# Patient Record
Sex: Male | Born: 1960 | Race: White | Hispanic: No | Marital: Married | Smoking: Never smoker
Health system: Southern US, Community
[De-identification: ages and names within clinical notes are randomized; demographics above are authoritative.]

## PROBLEM LIST (undated history)

## (undated) DIAGNOSIS — Z8616 Personal history of COVID-19: Secondary | ICD-10-CM

## (undated) DIAGNOSIS — Z9889 Other specified postprocedural states: Secondary | ICD-10-CM

## (undated) DIAGNOSIS — N400 Enlarged prostate without lower urinary tract symptoms: Secondary | ICD-10-CM

## (undated) DIAGNOSIS — F40240 Claustrophobia: Secondary | ICD-10-CM

## (undated) DIAGNOSIS — M1990 Unspecified osteoarthritis, unspecified site: Secondary | ICD-10-CM

## (undated) DIAGNOSIS — E785 Hyperlipidemia, unspecified: Secondary | ICD-10-CM

## (undated) DIAGNOSIS — Z8489 Family history of other specified conditions: Secondary | ICD-10-CM

## (undated) DIAGNOSIS — N429 Disorder of prostate, unspecified: Secondary | ICD-10-CM

## (undated) DIAGNOSIS — R399 Unspecified symptoms and signs involving the genitourinary system: Secondary | ICD-10-CM

## (undated) HISTORY — PX: TOTAL ELBOW ARTHROPLASTY: SHX812

## (undated) HISTORY — PX: ELBOW ARTHROPLASTY: SHX928

## (undated) HISTORY — PX: ELBOW SURGERY: SHX618

---

## 1998-12-14 HISTORY — PX: NM RENAL LASIX (ARMC HX): HXRAD1213

## 2003-03-12 ENCOUNTER — Encounter: Payer: Self-pay | Admitting: Family Medicine

## 2003-03-12 ENCOUNTER — Ambulatory Visit (HOSPITAL_COMMUNITY): Admission: RE | Admit: 2003-03-12 | Discharge: 2003-03-12 | Payer: Self-pay | Admitting: Family Medicine

## 2004-03-31 ENCOUNTER — Ambulatory Visit (HOSPITAL_COMMUNITY): Admission: RE | Admit: 2004-03-31 | Discharge: 2004-03-31 | Payer: Self-pay | Admitting: Family Medicine

## 2004-04-24 ENCOUNTER — Encounter (HOSPITAL_COMMUNITY): Admission: RE | Admit: 2004-04-24 | Discharge: 2004-05-24 | Payer: Self-pay | Admitting: Neurosurgery

## 2006-02-01 ENCOUNTER — Ambulatory Visit: Payer: Self-pay | Admitting: Orthopedic Surgery

## 2006-02-03 ENCOUNTER — Encounter (HOSPITAL_COMMUNITY): Admission: RE | Admit: 2006-02-03 | Discharge: 2006-03-05 | Payer: Self-pay | Admitting: Orthopedic Surgery

## 2006-03-08 ENCOUNTER — Ambulatory Visit: Payer: Self-pay | Admitting: Orthopedic Surgery

## 2006-03-29 ENCOUNTER — Ambulatory Visit: Payer: Self-pay | Admitting: Orthopedic Surgery

## 2006-04-19 ENCOUNTER — Ambulatory Visit: Payer: Self-pay | Admitting: Orthopedic Surgery

## 2007-05-18 ENCOUNTER — Encounter: Admission: RE | Admit: 2007-05-18 | Discharge: 2007-05-18 | Payer: Self-pay | Admitting: Rheumatology

## 2007-12-01 ENCOUNTER — Encounter (INDEPENDENT_AMBULATORY_CARE_PROVIDER_SITE_OTHER): Payer: Self-pay | Admitting: Orthopedic Surgery

## 2007-12-01 ENCOUNTER — Inpatient Hospital Stay (HOSPITAL_COMMUNITY): Admission: RE | Admit: 2007-12-01 | Discharge: 2007-12-03 | Payer: Self-pay | Admitting: Orthopedic Surgery

## 2008-03-08 ENCOUNTER — Encounter (INDEPENDENT_AMBULATORY_CARE_PROVIDER_SITE_OTHER): Payer: Self-pay | Admitting: Orthopedic Surgery

## 2008-03-08 ENCOUNTER — Inpatient Hospital Stay (HOSPITAL_COMMUNITY): Admission: RE | Admit: 2008-03-08 | Discharge: 2008-03-10 | Payer: Self-pay | Admitting: Orthopedic Surgery

## 2010-10-14 ENCOUNTER — Encounter (INDEPENDENT_AMBULATORY_CARE_PROVIDER_SITE_OTHER): Payer: Self-pay | Admitting: *Deleted

## 2011-01-04 ENCOUNTER — Encounter: Payer: Self-pay | Admitting: Orthopedic Surgery

## 2011-01-13 NOTE — Letter (Signed)
Summary: PCP Colonoscopy Referral, Unable to Reach  [facility]  [address]   [place], [place] [address]   Phone: [phone]  Fax: [phone]     October 14, 2010  Tyler Baxter [address] [place], [place]  [address] [birth year]   Dear Tyler Baxter,   Your primary care physician has requested we schedule you for a screening colonoscopy exam.  However, our office has been unable to contact you by phone.  Please call our office at [phone] and ask for the nurse.   Thank you,    Rosine Beat  [facility] Ph: [phone]    Fax: [phone]

## 2011-04-28 NOTE — Op Note (Signed)
NAMEEDGEL, DEGNAN NO.:  [id number]   MEDICAL RECORD NO.:  [id number]          PATIENT TYPE:  INP   LOCATION:  2899                         FACILITY:  [facility]   PHYSICIAN:  Dionne Ano. Gramig III, M.D.DATE OF BIRTH:  1961-02-16   DATE OF PROCEDURE:  DATE OF DISCHARGE:                               OPERATIVE REPORT   PREOPERATIVE DIAGNOSIS:  Inflammatory arthropathy, right elbow, with end-  stage degenerative changes, extensive synovectomy, and failure of  conservative management.   POSTOPERATIVE DIAGNOSIS:  Inflammatory arthropathy, right elbow, with  end-stage degenerative changes, extensive synovectomy, and failure of  conservative management.   PROCEDURE:  1. Right total elbow arthroplasty with a Biomet Discovery elbow      implant.  2. Hardware removal, right elbow.  3. Ulnar nerve decompression and anterior transposition.  4. Evaluation under anesthesia.  5. Extensive synovectomy, extensive capsular release secondary to      chronic contracture, right elbow.   SURGEON:  Dionne Ano. [name], M.D.   ASSISTANT:  Karie Chimera, P.A.-C.   COMPLICATIONS:  None.   ANESTHESIA:  General with preoperative block.   ESTIMATED BLOOD LOSS:  Less than 150 mL.   DRAINS:  One.   TOURNIQUET TIME:  The patient had a tourniquet insufflated for 2 hours.  He then had a 15-minute deflation time followed by an additional 15  minutes inflation for final component cementing.   INDICATIONS FOR PROCEDURE:  This patient is a very pleasant male who  presents with the above-mentioned diagnosis.  He has failed conservative  management of his inflammatory arthropathy.  He is 50 years of age and  has exhausted all forms of care.  He has extensive abnormality about his  elbow with loss of motion.  Due to this, we are planning to proceed with  the above-mentioned operative intervention.  I have discussed with him  the risks and benefits of bleeding, infection, anesthesia,  damage to  normal structures, and failure of surgery to accomplish its intended  goals of relieving symptoms and restoring function.  With this in mind,  he desires to proceed.  All questions have been encouraged and answered  preoperatively.   OPERATION IN DETAIL:  The patient was seen by myself and anesthesia.  The arm was marked.  The permit was signed.  Extensive counseling  performed in the holding area.  Preoperative block was placed by the  anesthesia department.  Following this, he was taken to the procedural  suite.  He underwent a general anesthetic and was prepped and draped in  the usual sterile fashion with Betadine scrub and paint x10 minute  scrub.  He was placed in a modified lateral position.  All body parts  were well-padded.   The operation then commenced after sterile field was secured with  inflation of a sterile tourniquet to 250 mmHg.  A posterior incision was  made, and skin flaps were elevated.  Following this, I turned down the  skin flaps and performed identification of the ulnar nerve.  The arcade  of Struthers, medial intermuscular septum, cubital tunnel, two heads of  the FCU, and Osborne's ligament were released.  The patient tolerated  this well, and there were no complicating features.  Following this, the  patient then underwent mobilization of the nerve upon its epineurial  plexus of vessels.  Once this was done, the patient then had a vessel  loop placed around the nerve gently, and it was placed in a protected  position anteriorly where it was transposed at the conclusion of the  case.  The patient tolerated this well.  There were no complicating  features.  This was a distinct and separate portion of the operation I  should note.   Following this, I then incised the triceps and turned down the triceps.  This was taken directly off the bone.  A large amount of time was taken  for meticulous dissection about the periosteal tissue.  Following this,   the triceps was then very carefully mobilized.  I released the  collateral ligaments as necessary and then exposed the elbow.  Once this  was done, I then had to perform an extensive synovectomy of the joint.  The patient had a large amount of synovitis, chronic in nature, and a  complete synovectomy was performed both posteriorly and anteriorly.  Following this, I then performed a major aggressive capsular release  with combination Cobb elevator, scissor, and orthopedic instrument.  This was rather difficult, as the patient had very significantly stuck  down capsule.  Once this was accomplished, I then irrigated copiously  and made a saw cut off of the tip of the olecranon.   Following this, I then performed hardware removal and prior suture  removal from the lateral ulnar collateral ligament repair.  The suture  anchor was in the area of deep dissection, and it was removed in its  entirety, taking care not to destabilize the humerus.  Following deep  hardware removal, attention was then turned towards the total elbow  arthroplasty.   As mentioned, a saw cut was made off of the olecranon tip.  Once this  was done, the ulna was taken out of harm's way and preparation of the  humerus was accomplished.  The patient had a guide placed followed by a  drill bit followed by a five step reamer followed by oscillating saw  used about the central area and removal of the olecranon fossa remnants  was accomplished.  I then used the barrel reamer to shape the area.  Following this, broaching and canal preparation was accomplished  according to standard Discovery elbow implantation technique.  I then  made adjustments accordingly and sized the patient up to a #5 humerus  implant.  This sat nicely, and I made sure that this was contoured  perfectly.  I was very pleased with this and the fit.   Following this, a wet lap was placed over this area, and I then turned  attention towards the ulna.  This  was repaired with combination bur  followed by reaming of the canal followed by broaching and barrel reamer  preparation so that the implant would sit perfectly flush against the  ulna.  I was very pleased with the placement and following this placed  all trial implants in and placed the patient through a range of motion.  There was no impingement, locking, catching, and excellent look was  accomplished.  The size 4 implant was the perfect fit for the ulna.  Following this, I then irrigated copiously, and following irrigation, I  placed cement restricters according to  appropriate height in the ulna  and humerus.  I then mixed cement and placed the humerus component to my  satisfaction.  This was prepared with third generation cement technique.  This was allowed to harden.  At this juncture, 2 hours had been reached  and I deflated the tourniquet.  I allowed for recirculation time of 15  minutes or so and then reinflated the tourniquet and implanted the ulna  component under tourniquet control.  I was able to achieve an excellent  cement mantle and cemented both components separately, as I wanted to  have a most excellent cement mantle and excellent stability.  I allowed  the cement to cure with the elbow in extended position and realized full  motion with flexion extension after the cement cured and the area was  tested.  The patient tolerated this well, and there were no complicating  features.   Following this, I then performed repair of the triceps through bony  drill holes.  A Krakow stitch was placed in the triceps, and the  extending sutures were placed through drill holes made in the ulna.  Mellody Dance needles were advanced appropriately, and the sutures were then  tied down and then weaved through the triceps once again for a double  secure fit and repair of the triceps.  I was pleased with this.  Following this, the remainder of the triceps, which was taken down in  tongue fashion,  was repaired with buried FiberWire in a baseball stitch.   Following this, I then checked the nerve once again.  It was transposed  nicely in a subcutaneous region, sitting off the flexor pronator region,  and I tacked down the subcu.  The wound was then irrigated.  A drain was  placed.  Excellent pulses were noted, and hemostasis was adequate.  We  then closed the wound with 3-0 Vicryl followed by staple clips.  A large  bulky soft tissue dressing was placed, and the patient had an anterior  slab placed without difficulty.  Following this, we then performed  removal of the drapes, and the patient was then extubated and taken to  the recovery room.  He tolerated the procedure well, and there were no  complicating features.  All sponge, needle, and instrument counts were  reported as correct.  We will continue very close observation and look  forward to proceeding with his was postop care.  I have gone over all  these notes, etc.   I will plan for an aggressive rehab postoperatively to get him moving  swiftly and quickly.  It has been a pleasure to participate in his care,  and I look forward to participating in his postoperative recovery.           ______________________________  Dionne Ano. Everlene Other, M.D.     Nash Mantis  D:  12/01/2007  T:  12/02/2007  Job:  [id number]

## 2011-04-28 NOTE — Op Note (Signed)
Tyler Baxter, DIRK NO.:  [id number]   MEDICAL RECORD NO.:  [id number]          PATIENT TYPE:  INP   LOCATION:  5024                         FACILITY:  [facility]   PHYSICIAN:  Tyler Ano. 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   FINDINGS:  This patient has failed conservative management of his  inflammatory arthropathy and has end stage degenerative changes.  He  presents for reconstruction.  He has had a similar procedure performed  on the right side.   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   PROCEDURE:  1. Left total elbow arthroplasty with Biomet Discovery elbow implant.  2. Ulnar nerve decompression and anterior transposition.  3. Evaluation under anesthesia, left elbow.  4. Extensive synovectomy with capsular release secondary to chronic      contracture, left elbow.   SURGEON:  Tyler Baxter, M.D.   ASSISTANT:  Karie Chimera, P.A.-C.   COMPLICATIONS:  None.   ANESTHESIA:  Infraclavicular block with IV sedation.   TOURNIQUET TIME:  125 minutes followed by a 20 minute deflation time  followed by an additional insufflation of less than an hour.   SPECIMENS:  One.   ESTIMATED BLOOD LOSS:  Les than 100 mL.   INDICATIONS FOR PROCEDURE:  The patient is a very pleasant gentleman who  presents for the above mentioned diagnosis.  He has failed conservative  management of his inflammatory arthropathy.  He is 50 years of age.  He  has previously undergone a right total elbow arthroplasty with excellent  improvement.  Unfortunately, he is unable to brush his teeth, move the  elbow, or perform any meaningful activities and has horrific pain.  He  states that he understands risks and  benefits surgery and lifting  precautions of no lifting over 5 pounds indefinitely and desires to  proceed with the arthroplasty.  He understands the risks and benefits of  bleeding, infection, anesthesia, damage to normal structures, and  failure of the surgery to accomplish its intended goals of relieving  symptoms and restoring function. With this in mind, he desires to  proceed.  All questions have been encouraged and answered  preoperatively.  He very well understands the pre and postop regimen,  etc.   OPERATION IN DETAIL:  The patient was seen by myself and anesthesia,  taken to the operating suite, underwent the smooth induction of IV  sedation.  The permit was signed, time out was called, and the patient  had an infraclavicular block placed by the anesthesia department  preoperatively as noted.  This provided excellent anesthesia and he was  able to be laid in modified lateral position with bean bag support and  was sedated for the procedure.  He was not given a general anesthetic.  He underwent a thorough prep and drape with Betadine scrub and paint  followed by placement of Ioban and a  stockinette and sterile tourniquet.  The operation commenced with insufflation of the tourniquet.  250 mmHg  tourniquet insufflation was accomplished.  A posterior midline incision  was made.   Prior to this, the patient had evaluation under anesthesia revealing  significant loss of motion and chronic capsular as well as bony  contracture.   Once the patient had an incision made, skin flaps were elevated and,  following, the ulnar nerve was identified, dissected and underwent a  full decompression about the arcade of Struthers medial intermuscular  septum which was excised, the cubital tunnel, Osborne's ligament and two  heads of the FCU.  Following this, the patient then underwent placement  of a vessel loop around the nerve and careful transposition to the soft  tissue.  I then performed a  triceps turndown flap. The triceps was  elevated off of the ulna and later in the case was reattached, of  course.  This exposed the left elbow nicely from a posterior approach.  Following this, the patient underwent identification of the radial head.  The radial head was identified and underwent resection.  I resected the  radial head without difficulty and the patient tolerated this quite  well.  Following radial head resection, which was done without  difficulty, we then performed an exuberant synovectomy and capsular as  well as bony contracture release with a combination rongeur, blunt and  sharp dissection.  The patient had specimens sent and a complete  synovectomy was performed.  The patient tolerated this well.   Following this, I then performed placement of the humeral guide, a drill  hole was placed, fossa reamer followed by oscillating saw followed by  barrel reamer was then placed.  I then opened the canal, broached, and  performed IM rod preparation up to a size 4.  He accommodated a size 4  well and following this, with a combination of barrel reamer and other  orthopedic instruments, he was shaped to have a nice fit for a size 4  humeral component.   Following this, attention was turned towards the ulna. With a  combination bur, reamer, and broaching, we sized him up to a size 4  about the ulna.  I then placed trial components. All looked well and I  was pleased with this.  He had excellent range of motion and fit.  Once  this done, the tourniquet was deflated for greater than 20 minutes.  During this time, we performed placement of a Krakow stitch in the  triceps for later reattachment.  We also irrigated copiously and put  canal/cement restricters in the ulna and humerus to our satisfaction.  Once this was done, I then irrigated copiously and performed suction  followed by placement of cement in the humerus and placement of a size 4  component.  I allowed two separate  cement settings due to his young age  and my desire to have the best cement mantle possible.  I then performed  a similar cementing technique with third generation cement mixing and  preparation technique followed by placement of the ulnar component.  The  patient had the trial bushings placed and he was allowed to set up in a  nice position.  Once this was done, the final bushings for a Discovery  total elbow were placed.  He had excellent range of motion, no crepitus,  locking, popping, catching, and all looked quite well and I was pleased  with the findings.  The patient had a size  4 ulnar component and a size  4 humerus component and standard Discovery bushings as well as cement  restricters.   Following this, I then reattached the triceps through drill holes and an  imbricating FiberWire stitch.  Following this, the retinaculum/fascia  was closed without difficulty.  Once this was done, the ulnar nerve was  transposed about the flexor pronator region in an anterior state.  The  patient tolerated this well without difficulty.  Two drains were placed.  The wound was then closed with 3-0 Vicryl followed by staple clips in  the skin edge.  He tolerated this well.  He was placed in a splint at 30  degrees of extension and had excellent refill to the fingers, a strong  radial pulse, and soft compartments.  He was taken to the recovery room.  We will plan for ice, Ancef immediately and q.8h. and careful cautious  observation.  We will plan for x-rays to be taken in the recovery room  to evaluate the arthroplasty,  of course.  I have discussed with him the do's and don'ts, etc.  He  tolerated the procedure beautifully and there were no complicating  features.  We look forward to participating in his postop care.  He will  proceed according to standard postop algorithm.           ______________________________  Tyler Ano. Everlene Other, M.D.     Nash Mantis  D:  03/08/2008  T:  03/09/2008   Job:  [id number]

## 2011-09-07 LAB — CBC
HCT: 43.7
Hemoglobin: 15.2
MCHC: 34.8
RBC: 4.91

## 2011-09-07 LAB — BASIC METABOLIC PANEL
CO2: 28
Calcium: 9.6
Chloride: 103
GFR calc Af Amer: >60
Potassium: 4.4
Sodium: 137

## 2011-09-07 LAB — URINALYSIS, ROUTINE W REFLEX MICROSCOPIC
Glucose, UA: NEGATIVE
Hgb urine dipstick: NEGATIVE
Protein, ur: NEGATIVE

## 2011-09-07 LAB — HEPATIC FUNCTION PANEL
ALT: 34
Albumin: 4
Alkaline Phosphatase: 60
Total Protein: 6.7

## 2011-09-18 LAB — BASIC METABOLIC PANEL
BUN: 14
BUN: 16
CO2: 26
Chloride: 103
Chloride: 103
Creatinine, Ser: 0.72
Creatinine, Ser: 0.9
GFR calc non Af Amer: >60

## 2011-09-18 LAB — URINALYSIS, ROUTINE W REFLEX MICROSCOPIC
Ketones, ur: 15 — AB
Nitrite: NEGATIVE
Protein, ur: NEGATIVE

## 2011-09-18 LAB — CBC
MCHC: 34.3
MCV: 90.9
MCV: 90.9
Platelets: 262
Platelets: 308
RDW: 13.6
WBC: 8.8

## 2015-08-29 ENCOUNTER — Other Ambulatory Visit: Payer: Self-pay | Admitting: Orthopedic Surgery

## 2015-11-04 NOTE — Pre-Procedure Instructions (Signed)
    Tyler Baxter  11/04/2015   Your procedure is scheduled on Thursday, December 1.  [facility] at [facility]:30[facility]                 Your surgery is scheduled for 3:35 P.M.  Call this number if you have problems the morning of surgery:[phone]                For any other questions, please call [phone], Monday - Friday 8 AM - 4 PM.   Remember:  Do not eat food or drink liquids after midnight  Wednesday, November 30.  Take these medicines the morning of surgery with A SIP OF WATER: none    Do not wear jewelry, make-up or nail polish.   Do not wear lotions, powders, or perfumes.     Men may shave face and neck.   Do not bring valuables to the hospital.   [facility] any belongings or valuables.  Contacts, dentures or bridgework may not be worn into surgery.  Leave your suitcase in the car.  After surgery it may be brought to your room.  For patients admitted to the hospital, discharge time will be determined by your treatment team.  Patients discharged the day of surgery will not be allowed to drive home.   Name and phone number of your driver: -  Special instructions: -   Please read over the following fact sheets that you were given. Pain Booklet, Coughing and Deep Breathing and Surgical Site Infection Prevention

## 2015-11-05 ENCOUNTER — Encounter (HOSPITAL_COMMUNITY): Payer: Self-pay

## 2015-11-05 ENCOUNTER — Encounter (HOSPITAL_COMMUNITY)
Admission: RE | Admit: 2015-11-05 | Discharge: 2015-11-05 | Disposition: A | Discharge status: Home / Self Care | Payer: BLUE CROSS/BLUE SHIELD | Source: Ambulatory Visit | Attending: Orthopedic Surgery | Admitting: Orthopedic Surgery

## 2015-11-05 DIAGNOSIS — Z96621 Presence of right artificial elbow joint: Secondary | ICD-10-CM | POA: Insufficient documentation

## 2015-11-05 DIAGNOSIS — Y838 Other surgical procedures as the cause of abnormal reaction of the patient, or of later complication, without mention of misadventure at the time of the procedure: Secondary | ICD-10-CM | POA: Insufficient documentation

## 2015-11-05 DIAGNOSIS — T84038A Mechanical loosening of other internal prosthetic joint, initial encounter: Secondary | ICD-10-CM | POA: Insufficient documentation

## 2015-11-05 DIAGNOSIS — Z01812 Encounter for preprocedural laboratory examination: Secondary | ICD-10-CM | POA: Diagnosis present

## 2015-11-05 HISTORY — DX: Family history of other specified conditions: Z84.89

## 2015-11-05 HISTORY — DX: Unspecified osteoarthritis, unspecified site: M19.90

## 2015-11-05 HISTORY — DX: Other specified postprocedural states: Z98.890

## 2015-11-05 HISTORY — DX: Disorder of prostate, unspecified: N42.9

## 2015-11-05 LAB — CBC
HCT: 46.2 % (ref 39.0–52.0)
HEMOGLOBIN: 15.7 g/dL (ref 13.0–17.0)
MCH: 31.2 pg (ref 26.0–34.0)
MCHC: 34 g/dL (ref 30.0–36.0)
MCV: 91.7 fL (ref 78.0–100.0)
PLATELETS: 215 10*3/uL (ref 150–400)
RBC: 5.04 MIL/uL (ref 4.22–5.81)
RDW: 13.3 % (ref 11.5–15.5)
WBC: 4.5 10*3/uL (ref 4.0–10.5)

## 2015-11-05 NOTE — Pre-Procedure Instructions (Addendum)
Tyler HamGregory S Baxter  11/05/2015   Your procedure is scheduled on Thursday, December 1.  [facility] at 1:30[facility]                 .  Call this number if you have problems the morning of surgery:[phone]                For any other questions, please call [phone], Monday - Friday 8 AM - 4 PM.   Remember:  Do not eat food or drink liquids after midnight  Wednesday, November 30.  Take these medicines the morning of surgery with A SIP OF WATER: none   STOP all herbel meds, nsaids (aleve,naproxen,advil,ibuprofen) 5 days prior to surgery starting 11/09/15 including vitamins, aspirin   Do not wear jewelry, make-up or nail polish.   Do not wear lotions, powders, or perfumes.     Men may shave face and neck.   Do not bring valuables to the hospital.   [facility] is not responsible for any belongings or valuables.  Contacts, dentures or bridgework may not be worn into surgery.  Leave your suitcase in the car.  After surgery it may be brought to your room.  For patients admitted to the hospital, discharge time will be determined by your treatment team.  Patients discharged the day of surgery will not be allowed to drive home.   Name and phone number of your driver: -  Special instructions: - Special Instructions: [place] - Preparing for Surgery  Before surgery, you can play an important role.  Because skin is not sterile, your skin needs to be as free of germs as possible.  You can reduce the number of germs on you skin by washing with CHG (chlorahexidine gluconate) soap before surgery.  CHG is an antiseptic cleaner which kills germs and bonds with the skin to continue killing germs even after washing.  Please DO NOT use if you have an allergy to CHG or antibacterial soaps.  If your skin becomes reddened/irritated stop using the CHG and inform your nurse when you arrive at Short Stay.  Do not shave (including legs and underarms) for at least 48  hours prior to the first CHG shower.  You may shave your face.  Please follow these instructions carefully:   1.  Shower with CHG Soap the night before surgery and the morning of Surgery.  2.  If you choose to wash your hair, wash your hair first as usual with your normal shampoo.  3.  After you shampoo, rinse your hair and body thoroughly to remove the Shampoo.  4.  Use CHG as you would any other liquid soap.  You can apply chg directly  to the skin and wash gently with scrungie or a clean washcloth.  5.  Apply the CHG Soap to your body ONLY FROM THE NECK DOWN.  Do not use on open wounds or open sores.  Avoid contact with your eyes ears, mouth and genitals (private parts).  Wash genitals (private parts)       with your normal soap.  6.  Wash thoroughly, paying special attention to the area where your surgery will be performed.  7.  Thoroughly rinse your body with warm water from the neck down.  8.  DO NOT shower/wash with your normal soap after using and rinsing off the CHG Soap.  9.  Pat yourself dry with a clean towel.  10.  Wear clean pajamas.            11.  Place clean sheets on your bed the night of your first shower and do not sleep with pets.  Day of Surgery  Do not apply any lotions/deodorants the morning of surgery.  Please wear clean clothes to the hospital/surgery center.   Please read over the following fact sheets that you were given. Pain Booklet, Coughing and Deep Breathing and Surgical Site Infection Prevention

## 2015-11-05 NOTE — Progress Notes (Signed)
Jan goltare called office to fix permit reason:  S/p not on approved abberviations

## 2015-11-13 MED ORDER — CEFAZOLIN SODIUM-DEXTROSE 2-3 GM-% IV SOLR
2.0000 g | INTRAVENOUS | Status: AC
  Administered 2015-11-14 (×2): 2 g via INTRAVENOUS
  Filled 2015-11-13 (×2): qty 50

## 2015-11-13 MED ORDER — CHLORHEXIDINE GLUCONATE 4 % EX LIQD: 60.0000 mL | Freq: Once | CUTANEOUS | Status: DC

## 2015-11-13 MED ORDER — SODIUM CHLORIDE 0.45 % IV SOLN: INTRAVENOUS | Status: DC

## 2015-11-14 ENCOUNTER — Observation Stay (HOSPITAL_COMMUNITY)
Admission: RE | Admit: 2015-11-14 | Discharge: 2015-11-17 | Disposition: A | Discharge status: Home / Self Care | Payer: BLUE CROSS/BLUE SHIELD | Source: Ambulatory Visit | Attending: Orthopedic Surgery | Admitting: Orthopedic Surgery

## 2015-11-14 ENCOUNTER — Encounter (HOSPITAL_COMMUNITY)
Admission: RE | Disposition: A | Discharge status: Home / Self Care | Payer: Self-pay | Source: Ambulatory Visit | Attending: Orthopedic Surgery

## 2015-11-14 ENCOUNTER — Ambulatory Visit (HOSPITAL_COMMUNITY): Payer: BLUE CROSS/BLUE SHIELD

## 2015-11-14 ENCOUNTER — Ambulatory Visit (HOSPITAL_COMMUNITY): Payer: BLUE CROSS/BLUE SHIELD | Admitting: Anesthesiology

## 2015-11-14 DIAGNOSIS — T84038A Mechanical loosening of other internal prosthetic joint, initial encounter: Secondary | ICD-10-CM | POA: Diagnosis present

## 2015-11-14 DIAGNOSIS — Z96621 Presence of right artificial elbow joint: Secondary | ICD-10-CM | POA: Diagnosis not present

## 2015-11-14 DIAGNOSIS — Z96629 Presence of unspecified artificial elbow joint: Secondary | ICD-10-CM

## 2015-11-14 DIAGNOSIS — M1990 Unspecified osteoarthritis, unspecified site: Secondary | ICD-10-CM | POA: Diagnosis not present

## 2015-11-14 DIAGNOSIS — M25521 Pain in right elbow: Secondary | ICD-10-CM | POA: Insufficient documentation

## 2015-11-14 DIAGNOSIS — Y793 Surgical instruments, materials and orthopedic devices (including sutures) associated with adverse incidents: Secondary | ICD-10-CM | POA: Insufficient documentation

## 2015-11-14 DIAGNOSIS — Z419 Encounter for procedure for purposes other than remedying health state, unspecified: Secondary | ICD-10-CM

## 2015-11-14 HISTORY — PX: TOTAL ELBOW ARTHROPLASTY: SHX812

## 2015-11-14 SURGERY — ARTHROPLASTY, ELBOW, TOTAL
Anesthesia: Regional | Site: Elbow | Laterality: Right

## 2015-11-14 MED ORDER — LACTATED RINGERS IV SOLN
INTRAVENOUS | Status: DC
  Administered 2015-11-14 (×3): via INTRAVENOUS

## 2015-11-14 MED ORDER — SODIUM CHLORIDE 0.9 % IR SOLN
Status: DC | PRN
  Administered 2015-11-14: 3000 mL

## 2015-11-14 MED ORDER — SODIUM CHLORIDE 0.45 % IV SOLN
INTRAVENOUS | Status: DC
  Administered 2015-11-14: 23:00:00 via INTRAVENOUS

## 2015-11-14 MED ORDER — PROPOFOL 10 MG/ML IV BOLUS
INTRAVENOUS | Status: DC | PRN
  Administered 2015-11-14: 200 mg via INTRAVENOUS

## 2015-11-14 MED ORDER — FENTANYL CITRATE (PF) 100 MCG/2ML IJ SOLN
INTRAMUSCULAR | Status: DC | PRN
  Administered 2015-11-14 (×2): 50 ug via INTRAVENOUS

## 2015-11-14 MED ORDER — EPHEDRINE SULFATE 50 MG/ML IJ SOLN
INTRAMUSCULAR | Status: DC | PRN
  Administered 2015-11-14: 5 mg via INTRAVENOUS
  Administered 2015-11-14 (×2): 10 mg via INTRAVENOUS

## 2015-11-14 MED ORDER — ONDANSETRON HCL 4 MG/2ML IJ SOLN
4.0000 mg | Freq: Four times a day (QID) | INTRAMUSCULAR | Status: DC | PRN
  Administered 2015-11-14 – 2015-11-15 (×2): 4 mg via INTRAVENOUS
  Filled 2015-11-14 (×2): qty 2

## 2015-11-14 MED ORDER — BUPIVACAINE-EPINEPHRINE (PF) 0.5% -1:200000 IJ SOLN
INTRAMUSCULAR | Status: DC | PRN
  Administered 2015-11-14: 25 mL via PERINEURAL

## 2015-11-14 MED ORDER — FENTANYL CITRATE (PF) 100 MCG/2ML IJ SOLN
INTRAMUSCULAR | Status: AC
  Administered 2015-11-14: 50 ug
  Filled 2015-11-14: qty 2

## 2015-11-14 MED ORDER — CEFAZOLIN SODIUM 1-5 GM-% IV SOLN
1.0000 g | Freq: Three times a day (TID) | INTRAVENOUS | Status: DC
  Administered 2015-11-15 – 2015-11-17 (×7): 1 g via INTRAVENOUS
  Filled 2015-11-14 (×9): qty 50

## 2015-11-14 MED ORDER — LIDOCAINE HCL (CARDIAC) 20 MG/ML IV SOLN
INTRAVENOUS | Status: DC | PRN
  Administered 2015-11-14: 80 mg via INTRAVENOUS

## 2015-11-14 MED ORDER — ONDANSETRON HCL 4 MG/2ML IJ SOLN
INTRAMUSCULAR | Status: AC
  Filled 2015-11-14: qty 2

## 2015-11-14 MED ORDER — ALPRAZOLAM 0.5 MG PO TABS: 0.5000 mg | ORAL_TABLET | Freq: Four times a day (QID) | ORAL | Status: DC | PRN

## 2015-11-14 MED ORDER — CEFAZOLIN SODIUM-DEXTROSE 2-3 GM-% IV SOLR
INTRAVENOUS | Status: AC
  Filled 2015-11-14: qty 50

## 2015-11-14 MED ORDER — HYDROMORPHONE HCL 1 MG/ML IJ SOLN: 0.5000 mg | INTRAMUSCULAR | Status: DC | PRN

## 2015-11-14 MED ORDER — DEXAMETHASONE SODIUM PHOSPHATE 10 MG/ML IJ SOLN
INTRAMUSCULAR | Status: DC | PRN
  Administered 2015-11-14: 10 mg via INTRAVENOUS

## 2015-11-14 MED ORDER — GLYCOPYRROLATE 0.2 MG/ML IJ SOLN
INTRAMUSCULAR | Status: AC
  Filled 2015-11-14: qty 1

## 2015-11-14 MED ORDER — FENTANYL CITRATE (PF) 100 MCG/2ML IJ SOLN
INTRAMUSCULAR | Status: AC
  Administered 2015-11-14: 50 ug via INTRAVENOUS
  Filled 2015-11-14: qty 2

## 2015-11-14 MED ORDER — CEFAZOLIN SODIUM 1-5 GM-% IV SOLN
1.0000 g | INTRAVENOUS | Status: DC
  Filled 2015-11-14: qty 50

## 2015-11-14 MED ORDER — FENTANYL CITRATE (PF) 250 MCG/5ML IJ SOLN
INTRAMUSCULAR | Status: AC
  Filled 2015-11-14: qty 5

## 2015-11-14 MED ORDER — ONDANSETRON HCL 4 MG/2ML IJ SOLN
INTRAMUSCULAR | Status: DC | PRN
  Administered 2015-11-14: 4 mg via INTRAVENOUS

## 2015-11-14 MED ORDER — EPHEDRINE SULFATE 50 MG/ML IJ SOLN
INTRAMUSCULAR | Status: AC
  Filled 2015-11-14: qty 1

## 2015-11-14 MED ORDER — NEOSTIGMINE METHYLSULFATE 10 MG/10ML IV SOLN
INTRAVENOUS | Status: AC
  Filled 2015-11-14: qty 1

## 2015-11-14 MED ORDER — ONDANSETRON HCL 4 MG PO TABS: 4.0000 mg | ORAL_TABLET | Freq: Four times a day (QID) | ORAL | Status: DC | PRN

## 2015-11-14 MED ORDER — ARTIFICIAL TEARS OP OINT
TOPICAL_OINTMENT | OPHTHALMIC | Status: DC | PRN
Start: 2015-11-14 — End: 2015-11-14
  Administered 2015-11-14: 1 via OPHTHALMIC

## 2015-11-14 MED ORDER — DIPHENHYDRAMINE HCL 25 MG PO CAPS: 25.0000 mg | ORAL_CAPSULE | Freq: Four times a day (QID) | ORAL | Status: DC | PRN

## 2015-11-14 MED ORDER — MIDAZOLAM HCL 5 MG/5ML IJ SOLN
INTRAMUSCULAR | Status: DC | PRN
  Administered 2015-11-14: 2 mg via INTRAVENOUS

## 2015-11-14 MED ORDER — PROMETHAZINE HCL 25 MG/ML IJ SOLN: 6.2500 mg | INTRAMUSCULAR | Status: DC | PRN

## 2015-11-14 MED ORDER — MIDAZOLAM HCL 2 MG/2ML IJ SOLN
INTRAMUSCULAR | Status: AC
Start: 2015-11-14 — End: 2015-11-14
  Administered 2015-11-14: 2 mg
  Filled 2015-11-14: qty 2

## 2015-11-14 MED ORDER — MIDAZOLAM HCL 2 MG/2ML IJ SOLN
INTRAMUSCULAR | Status: AC
  Filled 2015-11-14: qty 2

## 2015-11-14 MED ORDER — PROPOFOL 10 MG/ML IV BOLUS
INTRAVENOUS | Status: AC
  Filled 2015-11-14: qty 20

## 2015-11-14 MED ORDER — TAMSULOSIN HCL 0.4 MG PO CAPS
0.4000 mg | ORAL_CAPSULE | Freq: Every evening | ORAL | Status: DC
  Administered 2015-11-14 – 2015-11-16 (×3): 0.4 mg via ORAL
  Filled 2015-11-14 (×3): qty 1

## 2015-11-14 MED ORDER — METHOCARBAMOL 1000 MG/10ML IJ SOLN
500.0000 mg | Freq: Four times a day (QID) | INTRAMUSCULAR | Status: DC | PRN
  Filled 2015-11-14: qty 5

## 2015-11-14 MED ORDER — 0.9 % SODIUM CHLORIDE (POUR BTL) OPTIME
TOPICAL | Status: DC | PRN
  Administered 2015-11-14 (×3): 1000 mL

## 2015-11-14 MED ORDER — PROMETHAZINE HCL 25 MG RE SUPP: 12.5000 mg | Freq: Four times a day (QID) | RECTAL | Status: DC | PRN

## 2015-11-14 MED ORDER — ROCURONIUM BROMIDE 100 MG/10ML IV SOLN
INTRAVENOUS | Status: DC | PRN
  Administered 2015-11-14: 30 mg via INTRAVENOUS

## 2015-11-14 MED ORDER — DOCUSATE SODIUM 100 MG PO CAPS
100.0000 mg | ORAL_CAPSULE | Freq: Two times a day (BID) | ORAL | Status: DC
  Administered 2015-11-14 – 2015-11-17 (×6): 100 mg via ORAL
  Filled 2015-11-14 (×7): qty 1

## 2015-11-14 MED ORDER — ROCURONIUM BROMIDE 50 MG/5ML IV SOLN
INTRAVENOUS | Status: AC
  Filled 2015-11-14: qty 1

## 2015-11-14 MED ORDER — PANTOPRAZOLE SODIUM 40 MG PO TBEC: 40.0000 mg | DELAYED_RELEASE_TABLET | Freq: Two times a day (BID) | ORAL | Status: DC | PRN

## 2015-11-14 MED ORDER — ADULT MULTIVITAMIN W/MINERALS CH
1.0000 | ORAL_TABLET | Freq: Every day | ORAL | Status: DC
  Administered 2015-11-15 – 2015-11-17 (×3): 1 via ORAL
  Filled 2015-11-14 (×3): qty 1

## 2015-11-14 MED ORDER — LIDOCAINE HCL (CARDIAC) 20 MG/ML IV SOLN
INTRAVENOUS | Status: AC
  Filled 2015-11-14: qty 5

## 2015-11-14 MED ORDER — OXYCODONE HCL 5 MG PO TABS
5.0000 mg | ORAL_TABLET | ORAL | Status: DC | PRN
  Administered 2015-11-15: 10 mg via ORAL
  Filled 2015-11-14: qty 2

## 2015-11-14 MED ORDER — FENTANYL CITRATE (PF) 100 MCG/2ML IJ SOLN
25.0000 ug | INTRAMUSCULAR | Status: DC | PRN
Start: 2015-11-14 — End: 2015-11-14
  Administered 2015-11-14 (×2): 50 ug via INTRAVENOUS

## 2015-11-14 MED ORDER — METHOCARBAMOL 500 MG PO TABS
500.0000 mg | ORAL_TABLET | Freq: Four times a day (QID) | ORAL | Status: DC | PRN
  Administered 2015-11-15 – 2015-11-16 (×4): 500 mg via ORAL
  Filled 2015-11-14 (×4): qty 1

## 2015-11-14 SURGICAL SUPPLY — 82 items
BANDAGE ELASTIC 4 VELCRO ST LF (GAUZE/BANDAGES/DRESSINGS) ×2 IMPLANT
BLADE LONG MED 31X9 (MISCELLANEOUS) IMPLANT
BLADE SAW SAG 73X25 THK (BLADE) ×1
BLADE SAW SGTL 73X25 THK (BLADE) ×1 IMPLANT
BLADE SURG 10 STRL SS (BLADE) ×4 IMPLANT
BLADE SURG 15 STRL LF DISP TIS (BLADE) ×2 IMPLANT
BLADE SURG 15 STRL SS (BLADE) ×4
BNDG CMPR 9X4 STRL LF SNTH (GAUZE/BANDAGES/DRESSINGS) ×1
BNDG CONFORM 3 STRL LF (GAUZE/BANDAGES/DRESSINGS) ×2 IMPLANT
BNDG ESMARK 4X9 LF (GAUZE/BANDAGES/DRESSINGS) ×2 IMPLANT
BNDG GAUZE ELAST 4 BULKY (GAUZE/BANDAGES/DRESSINGS) ×2 IMPLANT
BUR ROUND FLUTED 4 SOFT TCH (BURR) IMPLANT
CEMENT BONE W/GENTAMICIN 40/20 (Cement) ×2 IMPLANT
CEMENT NOZZLE SMALL DIAMETER ×2 IMPLANT
CORDS BIPOLAR (ELECTRODE) ×2 IMPLANT
COVER SURGICAL LIGHT HANDLE (MISCELLANEOUS) ×2 IMPLANT
CUFF TOURNIQUET SINGLE 18IN (TOURNIQUET CUFF) ×2 IMPLANT
CUFF TOURNIQUET SINGLE 24IN (TOURNIQUET CUFF) IMPLANT
DRAPE INCISE IOBAN 66X45 STRL (DRAPES) ×2 IMPLANT
DRAPE OEC MINIVIEW 54X84 (DRAPES) IMPLANT
DRAPE SURG 17X23 STRL (DRAPES) ×2 IMPLANT
DRSG ADAPTIC 3X8 NADH LF (GAUZE/BANDAGES/DRESSINGS) ×2 IMPLANT
ELBOW RT ULNA 4X115MM BOND (Elbow) ×2 IMPLANT
ELECT REM PT RETURN 9FT ADLT (ELECTROSURGICAL) ×2
ELECTRODE REM PT RTRN 9FT ADLT (ELECTROSURGICAL) ×1 IMPLANT
EVACUATOR 1/8 PVC DRAIN (DRAIN) IMPLANT
GAUZE SPONGE 4X4 12PLY STRL (GAUZE/BANDAGES/DRESSINGS) ×2 IMPLANT
GAUZE XEROFORM 5X9 LF (GAUZE/BANDAGES/DRESSINGS) ×2 IMPLANT
GLOVE BIOGEL M STRL SZ7.5 (GLOVE) ×2 IMPLANT
GLOVE SURG ORTHO 8.0 STRL STRW (GLOVE) ×2 IMPLANT
GOWN STRL REUS W/ TWL LRG LVL3 (GOWN DISPOSABLE) ×2 IMPLANT
GOWN STRL REUS W/ TWL XL LVL3 (GOWN DISPOSABLE) ×3 IMPLANT
GOWN STRL REUS W/TWL LRG LVL3 (GOWN DISPOSABLE) ×4
GOWN STRL REUS W/TWL XL LVL3 (GOWN DISPOSABLE) ×6
GRAFT HEAD FEMORAL 43MM TISSUE (Bone Implant) ×2 IMPLANT
HANDPIECE INTERPULSE COAX TIP (DISPOSABLE)
HUMERAL CON SET-HEXALOBULAR (Orthopedic Implant) ×2 IMPLANT
KIT BASIN OR (CUSTOM PROCEDURE TRAY) ×2 IMPLANT
KIT DISC CONDYLE HEXALOBULAR (Orthopedic Implant) ×1 IMPLANT
KIT ROOM TURNOVER OR (KITS) ×2 IMPLANT
KIT TOTAL KNEE OPTIVAC 40/80GM (Knees) ×2 IMPLANT
LOOP VESSEL MAXI BLUE (MISCELLANEOUS) ×2 IMPLANT
MANIFOLD NEPTUNE II (INSTRUMENTS) ×2 IMPLANT
MILL MEDIUM DISP (BLADE) ×2 IMPLANT
NEEDLE HYPO 25GX1X1/2 BEV (NEEDLE) ×2 IMPLANT
NEEDLE STRAIGHT KEITH (NEEDLE) ×2 IMPLANT
NOZZLE CEMENT SMALL (Cement) ×2 IMPLANT
NS IRRIG 1000ML POUR BTL (IV SOLUTION) ×2 IMPLANT
PACK ORTHO EXTREMITY (CUSTOM PROCEDURE TRAY) ×2 IMPLANT
PAD ARMBOARD 7.5X6 YLW CONV (MISCELLANEOUS) ×4 IMPLANT
PAD CAST 4YDX4 CTTN HI CHSV (CAST SUPPLIES) ×2 IMPLANT
PADDING CAST ABS 4INX4YD NS (CAST SUPPLIES) ×1
PADDING CAST ABS COTTON 4X4 ST (CAST SUPPLIES) ×1 IMPLANT
PADDING CAST COTTON 4X4 STRL (CAST SUPPLIES) ×4
PASSER SUT SWANSON 36MM LOOP (INSTRUMENTS) ×4 IMPLANT
PLATE SUPERIOR MIDSHAGT 8H LT (Plate) ×2 IMPLANT
RETRIEVER SUT HEWSON (MISCELLANEOUS) ×2 IMPLANT
SET CYSTO W/LG BORE CLAMP LF (SET/KITS/TRAYS/PACK) ×2 IMPLANT
SET HNDPC FAN SPRY TIP SCT (DISPOSABLE) IMPLANT
SLING ARM FOAM STRAP LRG (SOFTGOODS) ×2 IMPLANT
SPLINT FIBERGLASS 4X30 (CAST SUPPLIES) ×2 IMPLANT
SPONGE LAP 4X18 X RAY DECT (DISPOSABLE) ×2 IMPLANT
SPONGE SCRUB IODOPHOR (GAUZE/BANDAGES/DRESSINGS) ×2 IMPLANT
STAPLER VISISTAT 35W (STAPLE) IMPLANT
SUCTION FRAZIER TIP 10 FR DISP (SUCTIONS) ×4 IMPLANT
SUT BONE WAX W31G (SUTURE) ×4 IMPLANT
SUT ETHIBOND NAB CT1 #1 30IN (SUTURE) IMPLANT
SUT ETHILON 4 0 FS 1 (SUTURE) ×2 IMPLANT
SUT FIBERWIRE #2 38 REV NDL BL (SUTURE) ×4
SUT VIC AB 3-0 PS1 18 (SUTURE) ×2
SUT VIC AB 3-0 PS1 18XBRD (SUTURE) ×1 IMPLANT
SUT VICRYL 0 CT 1 36IN (SUTURE) ×2 IMPLANT
SUTURE FIBERWR#2 38 REV NDL BL (SUTURE) ×2 IMPLANT
SYR 3ML LL SCALE MARK (SYRINGE) ×2 IMPLANT
SYR CONTROL 10ML LL (SYRINGE) ×2 IMPLANT
TOWEL OR 17X24 6PK STRL BLUE (TOWEL DISPOSABLE) ×2 IMPLANT
TOWEL OR 17X26 10 PK STRL BLUE (TOWEL DISPOSABLE) ×4 IMPLANT
TOWER CARTRIDGE SMART MIX (DISPOSABLE) ×2 IMPLANT
TRAY FOLEY CATH 16FRSI W/METER (SET/KITS/TRAYS/PACK) IMPLANT
TUBE CONNECTING 12X1/4 (SUCTIONS) ×2 IMPLANT
UNDERPAD 30X30 INCONTINENT (UNDERPADS AND DIAPERS) ×2 IMPLANT
WATER STERILE IRR 1000ML POUR (IV SOLUTION) ×2 IMPLANT

## 2015-11-14 NOTE — Anesthesia Postprocedure Evaluation (Signed)
Anesthesia Post Note  Patient: Tyler Baxter  Procedure(s) Performed: Procedure(s) (LRB): RIGHT TOTAL ELBOW REVISION  ARTHROPLASTY  (Right)  Patient location during evaluation: PACU Anesthesia Type: General Level of consciousness: awake and alert Pain management: pain level controlled Vital Signs Assessment: post-procedure vital signs reviewed and stable Respiratory status: spontaneous breathing, nonlabored ventilation, respiratory function stable and patient connected to nasal cannula oxygen Cardiovascular status: blood pressure returned to baseline and stable Postop Assessment: no signs of nausea or vomiting Anesthetic complications: no    Last Vitals:  Filed Vitals:   11/14/15 2200 11/14/15 2205  BP: 104/62   Pulse: 94 93  Temp:    Resp: 19 19    Last Pain:  Filed Vitals:   11/14/15 2212  PainSc: 5                  Averyanna Sax A

## 2015-11-14 NOTE — H&P (Signed)
Tyler Baxter is an 54 y.o. male.   Chief Complaint: right elbow pain HPI:   In the patient is a pleasant 54 year old male w arthropathy of the joint.ho is well known to our practice. He is status post a total elbow arthroplasty about the right upper extremity performed in 2012 secondary to significant inflammatory Arthropathy. This is done very well up into the past year and he's noted and difficulties out the upper extremity. He is noted to have Failing components about the elbow construct and thus we discussed with him at length the need for revision arthroplasty. Patient desires to proceed. Counseled him in length in regards to the risk and benefits and meaningful expectations. The operative labs are reviewed the patient is stable to proceed with surgical endeavors.    Past Medical History  Diagnosis Date  . Family history of adverse reaction to anesthesia     father has nausea and vomiting  . Prostate disorder   . Arthritis   . S/P LASIK surgery     bilateral    Past Surgical History  Procedure Laterality Date  . Elbow arthroplasty Bilateral     09 last    No family history on file. Social History:  reports that he has never smoked. He does not have any smokeless tobacco history on file. He reports that he does not drink alcohol or use illicit drugs.  Allergies: No Known Allergies  Medications Prior to Admission  Medication Sig Dispense Refill  . tadalafil (CIALIS) 5 MG tablet Take 5 mg by mouth daily as needed for erectile dysfunction.    . tamsulosin (FLOMAX) 0.4 MG CAPS capsule Take 0.4 mg by mouth every evening.      No results found for this or any previous visit (from the past 48 hour(s)). No results found.  Review of Systems  Constitutional: Negative.   HENT: Negative.   Eyes: Negative.   Respiratory: Negative.   Cardiovascular: Negative.   Gastrointestinal: Negative.   Genitourinary: Negative.   Musculoskeletal:       See HPI  Skin: Negative.    Neurological: Negative.   Endo/Heme/Allergies: Negative.     Blood pressure 103/70, pulse 76, temperature 97.1 F (36.2 C), temperature source Oral, resp. rate 30, height  (1.778 m), weight 78.109 kg (172 lb 3.2 oz), SpO2 96 %. Physical Exam  The patient is alert and oriented in no acute distress. The patient complains of pain in the affected upper extremity.  The patient is noted to have a normal HEENT exam. Lung fields show equal chest expansion and no shortness of breath. Abdomen exam is nontender without distention. Lower extremity examination does not show any fracture dislocation or blood clot symptoms. Pelvis is stable and the neck and back are stable and nontender. RUE: neurovascular intact, no signs of infection are present, skin intact, well healed incision posteriorly  Assessment/Plan S/P right total elbow arthroplasty in a 21 male performed in 2002 secondary to advanced inflammatory arthropathy with failed hardware We are planning surgery for your upper extremity. The risk and benefits of surgery to include risk of bleeding, infection, anesthesia,  damage to normal structures and failure of the surgery to accomplish its intended goals of relieving symptoms and restoring function have been discussed in detail. With this in mind we plan to proceed. I have specifically discussed with the patient the pre-and postoperative regime and the dos and don'ts and risk and benefits in great detail. Risk and benefits of surgery also include risk of  dystrophy(CRPS), chronic nerve pain, failure of the healing process to go onto completion and other inherent risks of surgery The relavent the pathophysiology of the disease/injury process, as well as the alternatives for treatment and postoperative course of action has been discussed in great detail with the patient who desires to proceed.  We will do everything in our power to help you (the patient) restore function to the upper extremity. It  is a pleasure to see this patient today.   Haven Foss L 11/14/2015, 3:51 PM

## 2015-11-14 NOTE — Op Note (Signed)
See dictation# [id number] Tyler PeaGramig MD

## 2015-11-14 NOTE — Anesthesia Preprocedure Evaluation (Signed)
Anesthesia Evaluation  Patient identified by MRN, date of birth, ID band Patient awake    Reviewed: Allergy & Precautions, H&P , NPO status , Patient's Chart, lab work & pertinent test results  History of Anesthesia Complications Negative for: history of anesthetic complications  Airway Mallampati: II  TM Distance: >3 FB Neck ROM: full    Dental no notable dental hx.    Pulmonary neg pulmonary ROS,    Pulmonary exam normal breath sounds clear to auscultation       Cardiovascular negative cardio ROS Normal cardiovascular exam Rhythm:regular Rate:Normal     Neuro/Psych negative neurological ROS     GI/Hepatic negative GI ROS, Neg liver ROS,   Endo/Other  negative endocrine ROS  Renal/GU negative Renal ROS     Musculoskeletal  (+) Arthritis ,   Abdominal   Peds  Hematology negative hematology ROS (+)   Anesthesia Other Findings   Reproductive/Obstetrics negative OB ROS                             Anesthesia Physical Anesthesia Plan  ASA: II  Anesthesia Plan: General and Regional   Post-op Pain Management: GA combined w/ Regional for post-op pain   Induction: Intravenous  Airway Management Planned: LMA  Additional Equipment:   Intra-op Plan:   Post-operative Plan: Extubation in OR  Informed Consent: I have reviewed the patients History and Physical, chart, labs and discussed the procedure including the risks, benefits and alternatives for the proposed anesthesia with the patient or authorized representative who has indicated his/her understanding and acceptance.   Dental Advisory Given  Plan Discussed with: Anesthesiologist, CRNA and Surgeon  Anesthesia Plan Comments:         Anesthesia Quick Evaluation

## 2015-11-14 NOTE — Transfer of Care (Signed)
Immediate Anesthesia Transfer of Care Note  Patient: Tyler Baxter  Procedure(s) Performed: Procedure(s): RIGHT TOTAL ELBOW REVISION  ARTHROPLASTY  (Right)  Patient Location: PACU  Anesthesia Type:General and Regional  Level of Consciousness: awake, alert  and oriented  Airway & Oxygen Therapy: Patient Spontanous Breathing and Patient connected to nasal cannula oxygen  Post-op Assessment: Report given to RN and Post -op Vital signs reviewed and stable  Post vital signs: Reviewed and stable  Last Vitals:  Filed Vitals:   11/14/15 1525 11/14/15 1535  BP: 110/74 103/70  Pulse: 69 76  Temp:    Resp: 18 30    Complications: No apparent anesthesia complications

## 2015-11-14 NOTE — Progress Notes (Signed)
Pt refused sacral foam prophylaxis. 

## 2015-11-14 NOTE — Anesthesia Procedure Notes (Addendum)
Anesthesia Regional Block:  Supraclavicular block  Pre-Anesthetic Checklist: ,, timeout performed, Correct Patient, Correct Site, Correct Laterality, Correct Procedure, Correct Position, site marked, Risks and benefits discussed,  Surgical consent,  Pre-op evaluation,  At surgeon's request and post-op pain management  Laterality: Right  Prep: chloraprep       Needles:  Injection technique: Single-shot  Needle Type: Echogenic Stimulator Needle     Needle Length: 5cm 5 cm Needle Gauge: 21 and 21 G    Additional Needles:  Procedures: ultrasound guided (picture in chart) Supraclavicular block Narrative:  Injection made incrementally with aspirations every 5 mL.  Performed by: Personally  Anesthesiologist: JUDD, BENJAMIN  Additional Notes: Risks, benefits and alternative to block explained extensively.  Patient tolerated procedure well, without complications.   Procedure Name: Intubation Date/Time: 11/14/2015 4:39 PM Performed by: Renford DillsMULLINS, Kylia Grajales L Pre-anesthesia Checklist: Patient identified, Emergency Drugs available, Suction available and Patient being monitored Patient Re-evaluated:Patient Re-evaluated prior to inductionOxygen Delivery Method: Circle system utilized Preoxygenation: Pre-oxygenation with 100% oxygen Intubation Type: IV induction Ventilation: Mask ventilation without difficulty Laryngoscope Size: Miller and 2 Grade View: Grade II Tube type: Oral Tube size: 7.0 mm Number of attempts: 1 Airway Equipment and Method: Stylet Placement Confirmation: ETT inserted through vocal cords under direct vision,  positive ETCO2,  CO2 detector and breath sounds checked- equal and bilateral Secured at: 23 cm Tube secured with: Tape Dental Injury: Teeth and Oropharynx as per pre-operative assessment

## 2015-11-15 ENCOUNTER — Encounter (HOSPITAL_COMMUNITY): Payer: Self-pay | Admitting: Orthopedic Surgery

## 2015-11-15 DIAGNOSIS — T84038A Mechanical loosening of other internal prosthetic joint, initial encounter: Secondary | ICD-10-CM | POA: Diagnosis not present

## 2015-11-15 MED ORDER — TRAMADOL HCL 50 MG PO TABS
100.0000 mg | ORAL_TABLET | Freq: Four times a day (QID) | ORAL | Status: DC
  Administered 2015-11-15 – 2015-11-17 (×7): 100 mg via ORAL
  Filled 2015-11-15 (×8): qty 2

## 2015-11-15 MED ORDER — DEXMEDETOMIDINE HCL IN NACL 200 MCG/50ML IV SOLN
INTRAVENOUS | Status: AC
  Filled 2015-11-15: qty 50

## 2015-11-15 NOTE — Progress Notes (Signed)
PT Cancellation Note  Patient Details Name: Tyler Baxter MRN: [id number] DOB: 04/26/1961   Cancelled Treatment:    Reason Eval/Treat Not Completed: PT screened, no needs identified, will sign off.   Jenesa Foresta LUBECK 11/15/2015, 11:49 AM

## 2015-11-15 NOTE — Evaluation (Signed)
Occupational Therapy Evaluation Patient Details Name: Tyler Baxter MRN: [id number] DOB: 12-15-1960 Today's Date: 11/15/2015    History of Present Illness RIGHT TOTAL ELBOW REVISION ARTHROPLASTY (Right)   Clinical Impression    Pt reports he was independent with ADLs and mobility PTA. Currently pt is min assist overall for ADLs and supervision for functional mobility. All education complete; pt and wife with no further questions or concerns for OT at this time. Pt planning to d/c home with 24/7 supervision from his wife for the first few days. Pt ready to d/c from an OT standpoint; signing off at this time. Thank you for this referral.     Follow Up Recommendations  No OT follow up;Supervision - Intermittent    Equipment Recommendations  None recommended by OT    Recommendations for Other Services       Precautions / Restrictions Precautions Precautions: None Restrictions RUE Weight Bearing:  (No orders but maintained NWB on RUE)      Mobility Bed Mobility Overal bed mobility: Needs Assistance Bed Mobility: Supine to Sit;Sit to Supine     Supine to sit: Supervision Sit to supine: Supervision   General bed mobility comments: Good technique, maintaining NWB on RUE. Supervision for safety.  Transfers Overall transfer level: Needs assistance   Transfers: Sit to/from Stand Sit to Stand: Supervision         General transfer comment: Supervision for safety. Good technique and hand placement. Sit to stand from EOB x 1, toilet x 1    Balance Overall balance assessment: No apparent balance deficits (not formally assessed)                                          ADL Overall ADL's : Needs assistance/impaired Eating/Feeding: Set up;Sitting   Grooming: Wash/dry hands;Supervision/safety;Standing   Upper Body Bathing: Minimal assitance;Sitting   Lower Body Bathing: Minimal assistance;Sit to/from stand   Upper Body Dressing : Minimal  assistance;Sitting Upper Body Dressing Details (indicate cue type and reason): Educated on compensatory strategies for UB ADLs; pt and wife verbalized understanding Lower Body Dressing: Minimal assistance;Sit to/from stand   Toilet Transfer: Supervision/safety;Ambulation;Comfort height toilet   Toileting- Clothing Manipulation and Hygiene: Supervision/safety;Sit to/from stand       Functional mobility during ADLs: Supervision/safety General ADL Comments: Pts wife present for OT eval. Educated on home safety, need for supervision during ADLs and mobility, wiggling fingers throughout the day, edema management including elevation and ice; pt verbalized understanding. Wife reports she is prepared to assist with ADLs as needed; no questions or concerns      Vision     Perception     Praxis      Pertinent Vitals/Pain Pain Assessment: Faces Faces Pain Scale: Hurts little more Pain Location: R elbow Pain Descriptors / Indicators: Sore Pain Intervention(s): Monitored during session;Limited activity within patient's tolerance;Repositioned;Ice applied     Hand Dominance Right   Extremity/Trunk Assessment Upper Extremity Assessment Upper Extremity Assessment: RUE deficits/detail;LUE deficits/detail RUE Deficits / Details: Finger ROM/strength WFL. Slight tingling in fingers  RUE: Unable to fully assess due to immobilization LUE Deficits / Details: [facility]   Lower Extremity Assessment Lower Extremity Assessment: Overall WFL for tasks assessed   Cervical / Trunk Assessment Cervical / Trunk Assessment: Normal   Communication Communication Communication: No difficulties   Cognition Arousal/Alertness: Awake/alert Behavior During Therapy: WFL for tasks assessed/performed Overall Cognitive Status: Within  Functional Limits for tasks assessed                     General Comments       Exercises       Shoulder Instructions      Home Living Family/patient expects to be  discharged to:: Private residence Living Arrangements: Spouse/significant other Available Help at Discharge: Family;Available 24 hours/day Type of Home: House Home Access: Stairs to enter Enter[employer] CorporationEntrance Stairs-Number of Steps: 2   Home Layout: One level     Bathroom Shower/Tub: Tub/shower unit;Walk-in shower (Uses walk in shower)   Bathroom Toilet: Handicapped height     Home Equipment: None          Prior Functioning/Environment Level of Independence: Independent             OT Diagnosis: Acute pain   OT Problem List:     OT Treatment/Interventions:      OT Goals(Current goals can be found in the care plan section) Acute Rehab OT Goals Patient Stated Goal: to go home OT Goal Formulation: With patient/family  OT Frequency:     Barriers to D/C:            Co-evaluation              End of Session Equipment Utilized During Treatment: Gait belt  Activity Tolerance: Patient tolerated treatment well Patient left: in bed;with call bell/phone within reach;with family/visitor present   Time: [id number] OT Time Calculation (min): 25 min Charges:  OT General Charges $OT Visit: 1 Procedure OT Evaluation $Initial OT Evaluation Tier I: 1 Procedure OT Treatments $Self Care/Home Management : 8-22 mins G-Codes: OT G-codes **NOT FOR INPATIENT CLASS** Functional Assessment Tool Used: Clinical judgement Functional Limitation: Self care Self Care Current Status ([id number](G8987): At least 1 percent but less than 20 percent impaired, limited or restricted Self Care Goal Status ([id number](G8988): At least 1 percent but less than 20 percent impaired, limited or restricted Self Care Discharge Status [phone]): At least 1 percent but less than 20 percent impaired, limited or restricted   Gaye AlkenBailey A Jamin Panther M.S., OTR/L Pager: [phone]  11/15/2015, 10:57 AM

## 2015-11-15 NOTE — Op Note (Signed)
[name], Tyler Baxter NO.:  [id number]  MEDICAL RECORD NO.:  [id number]  LOCATION:  5N15C                        FACILITY:  [facility]  PHYSICIAN:  Dionne Ano. Cara Aguino, M.D.DATE OF BIRTH:  13-Apr-1961  DATE OF PROCEDURE: DATE OF DISCHARGE:                              OPERATIVE REPORT   PREOPERATIVE DIAGNOSIS:  Aseptic loosening, total elbow arthroplasty, ulnar component with pain and dysfunction.  POSTOPERATIVE DIAGNOSIS:  Aseptic loosening, total elbow arthroplasty, ulnar component with pain and dysfunction, with partial triceps avulsion about the olecranon tip.  This patient had a well-fixed humeral prosthesis.  Ulnar prosthesis was loose.  SURGICAL PROCEDURE PERFORMED: 1. Removal of a 75 mm size 4 Biomet discovery elbow, ulnar prosthesis     and associated bushings (hardware removal), right elbow. 2. Revision total elbow arthroplasty with long stem ulnar component     size 4 Biomet discovery elbow prosthesis (115 mm in length).  This     patient underwent allograft impaction bone graft proximally and was     well seated distally with cement technique as we went past the area     of loosening.  This was a complete revision total elbow     arthroplasty about the ulnar component.  The humeral component was     intact. 3. Triceps reattachment, right elbow/triceps repair right elbow, with     a bony drill hole technique. 4. AP, lateral, and oblique x-rays performed, examined, and     interpreted by myself and my assistant Mr. Wynona Neat.  SURGEON:  Dionne Ano. [name], M.D.  ASSISTANT:  Karie Chimera, PA-C.  ANESTHESIA:  General with preoperative block.  TOURNIQUET TIME:  Less than 2 hours.  INDICATIONS FOR THE PROCEDURE:  Tyler Baxter is a 54 year old male.  He had complete obliteration of his elbow and was unable to brush his teeth approximately 8 years ago.  We performed a total elbow arthroplasty.  He has actually had bilateral total elbow arthroplasty.  From the  left side, it looks perfect.  The right side unfortunately has had loosening in the ulnar stem.  He has been very aggressive and admits to using a chainsaw as well as doing other aggressive activities.  I did feel that he would be best served with revision total elbow arthroplasty to try and give him the best function possible, given his pain and dysfunction. He understands risks and benefits of surgery and I have once again outlined our protocol and precautions after total elbow arthroplasty, which I feel he desperately needs to adhere to.  OPERATIVE PROCEDURE IN DETAIL:  The patient was seen by myself and Anesthesia.  He was taken to the operative theater and underwent a smooth induction of general anesthesia.  Preoperatively, a block was placed.  His arm was prepped with 2 separate Hibiclens scrubs followed by 10 minutes surgical Betadine scrub.  Once this was complete, the antibiotics were infused.  Time-out was observed.  Arm was elevated and a sterile tourniquet was insufflated.  He was placed in a modified lateral position.  Body parts well padded and axillary roll placed. Once this was done, posterior utilitarian incision was made.  Dissection was carried down.  The triceps  was identified and interestingly had a defect in the triceps, where there was a partial near complete evulsion. I very carefully sculpted from medial to lateral.  This triceps I left the lateral portion intact and undisturbed and following this used a triceps sliding technique if you will as described by Encarnacion SlatesMorey.  This is typically a triceps sparing approach, where some of the attachment was left intact; however, in Tyler Baxter's case, he already avulsed distally and thus we simply kept the lateral attachments.  I very carefully palpated the ulnar nerve, but did not dissect as it had been previously successfully transposed.  At this time, we accessed the joint.  We removed metallosis and capsular inflammation.  The  patient had metallosis indicative of loosening distally.  This was all removed without complicating feature and he tolerated this well.  Following this, the ulnar prosthesis was removed.  The patient then had significant cementing extraction performed.  Combination of flexible reamers, curette, guide pin, and cannulated drill were used as well as pituitary rongeur to remove his most cement as possible.  Following this, I was able to go past the area of osteolysis proximally and see the size 4 extended ulnar prosthesis.  I trialed this, it looked good. We made adjustments accordingly.  The cement extraction proved to be somewhat difficult, but we were able to extract the cement without any iatrogenic fracture and was able to bypass this area of osteolysis. Once this was complete, we then irrigated copiously and I deflated the tourniquet at an hour.  With the tourniquet deflated, we performed very careful and cautious evaluation.  Following this, I chose a size 4, 115 mm extended Biomet discovery elbow ulnar prosthesis.  I opened this and placed it in the proposed canal of course.  Following this, with femoral head cancellous bone graft, we packed allograft in this area.  This was an allograft femoral head.  It was morselized and then packed approximately for an impaction technique.  Following this, we then packed it down and then dried the area.  We irrigated prior to impaction grafting.  Once this was complete, we then very carefully and cautiously inflated the tourniquet.  We used medium viscosity cement, impregnated with gentamicin.  Once this was complete, we then placed this in a cement gun.  We appropriately pressured the cement and then inserted the final prosthesis.  This went quite well.  The cement was allowed to cure following this bushings were placed and the elbow was read linked without difficulty.  The patient tolerated this well.  Prior to cementing the prosthesis in  place, I made 2 drill holes in the tip of the olecranon and pre-placed a 4 FiberWire sutures of the #2 variety so that we could use a modified Krackow stitch to reattach the triceps.  This proved to be quite excellent in terms of its repair.  The sutures were placed through eyelets after the cement was cured, which were pre-placed in terms of shuttle passers through the bone.  Once the cement was allowed to cure, we then shuttled the triceps sutures through the bony drill holes, pre-placed of course and then repaired the triceps.  Triceps was repaired without difficulty and there were no complicating features.  He had a good firm sound fit.  Once this was complete, we then performed a closure of the fascia.  Final copy x-rays were taken.  AP and lateral and all looked quite well. I was very pleased with this and the findings.  Once this was complete, we then irrigated additionally.  I should note that we used 6 L of irrigant.  We were very meticulous in our technique. The ulnar nerve sat tension free and was palpable anteriorly.  The prosthesis looked quite well in terms of its position on x-ray.  Thus triceps reattachment, revision total elbow arthroplasty, and hardware removal as well as x-ray evaluation were performed.  We were quite pleased with this and the findings.  There was a question about the needle count and thus we did take an additional x-ray, which showed no evidence of any type of foreign material such as needle in the wound I should note.  The patient had the fascia closed with a 2-0 FiberWire.  The subcutaneous was closed with Vicryl and the skin edge closed with Prolene.  We will monitor him closely.  Admission to the hospital, IV antibiotics and general postoperative care will be adhered to.  Should any problems occur, he will notify us.  Pleasure seeing him today and to participate in his care.  All questions have been encouraged and answered.  This was an  uncomplicated revision total elbow arthroplasty.  We extended his stem and hopefully Tyler Baxter can realize the precautions and have additional longevity for quite some period of time with his total elbow replacement.  It was a pleasure seeing him today and participate in his care.  There were no immediate complicating features.  We did I and O cath him at the end of the case.  As the case was approximately 3.5 to 4.5 hours in duration.     Dionne Ano. [name], M.D.     [facility]  D:  11/14/2015  T:  11/15/2015  Job:  [id number]

## 2015-11-15 NOTE — Progress Notes (Signed)
Patient ID: Tyler Baxter, male   DOB: 07/27/1961, 54 y.o.   MRN: [id number] Patient is alert and oriented.  Patient notes no locking popping catching in his fingers. He has normal sensation and can flex and extend the fingers without difficulty. I'm quite pleased to seem looking so well.  He has no signs of DVT infection vascular compromise or other problems.  I've removed his drain.  He is tolerating a regular diet. He requests to switch to tramadol.  I discussed him the operation the issues involved in his revision total elbow replacement and issues going forward.  When a long talk with his wife at bedside going over all issues.  We'll see how he looks tonight and continue IV antibiotics pain medicine and observatory care. We will proceed with DC to home once completely stable  The patient is alert and oriented in no acute distress. The patient complains of pain in the affected upper extremity.  The patient is noted to have a normal HEENT exam. Lung fields show equal chest expansion and no shortness of breath. Abdomen exam is nontender without distention. Lower extremity examination does not show any fracture dislocation or blood clot symptoms. Pelvis is stable and the neck and back are stable and nontender.  [name] MD

## 2015-11-16 DIAGNOSIS — T84038A Mechanical loosening of other internal prosthetic joint, initial encounter: Secondary | ICD-10-CM | POA: Diagnosis not present

## 2015-11-16 MED ORDER — CEPHALEXIN 500 MG PO CAPS: 500.0000 mg | ORAL_CAPSULE | Freq: Four times a day (QID) | ORAL | Status: DC

## 2015-11-16 MED ORDER — METHOCARBAMOL 500 MG PO TABS: 500.0000 mg | ORAL_TABLET | Freq: Four times a day (QID) | ORAL | Status: DC | PRN

## 2015-11-16 MED ORDER — OXYCODONE HCL 5 MG PO TABS: 5.0000 mg | ORAL_TABLET | ORAL | Status: DC | PRN

## 2015-11-16 MED ORDER — TRAMADOL HCL 50 MG PO TABS: 100.0000 mg | ORAL_TABLET | Freq: Four times a day (QID) | ORAL | Status: DC | PRN

## 2015-11-16 NOTE — Discharge Instructions (Signed)

## 2015-11-16 NOTE — Progress Notes (Signed)
Patient ID: Tyler HamGregory S Baxter, male   DOB: 04/13/1961, 54 y.o.   MRN: [id number]   postop day 2 status post revision total elbow arthroplasty with impaction grafting and cementing revision ulna long stem right elbow    Patient is alert and oriented he feels well Tolerating regular diet Voiding well He notes no neurosensory abnormality in the arm. He is fairly comfortable but still has quite a bit of pain in the elbow which is expected  Physical examination shows that he is neurovascularly intact. Range of motion to the fingers and sensory examination is normal there's no comp cutting features today. His bandages clean dry and intact. The patient is alert and oriented in no acute distress. The patient complains of pain in the affected upper extremity.  The patient is noted to have a normal HEENT exam. Lung fields show equal chest expansion and no shortness of breath. Abdomen exam is nontender without distention. Lower extremity examination does not show any fracture dislocation or blood clot symptoms. Pelvis is stable and the neck and back are stable and nontender. Overall Tyler Baxter is doing quite well there is no complicating  features today.  He is doing well on tramadol. We'll plan to send him home tomorrow on tramadol, oxycodone, Keflex 7 days and Robaxin when necessary spasm  He will continue with dye.  He understands his precautions.  He will see us in 12 days in our office will call to make the appointment.  We've had once again a very long and purposeful discussion about going forward in terms of his rehabilitation and restrictions  He understands all issues.  Jameela Michna MD

## 2015-11-17 DIAGNOSIS — T84038A Mechanical loosening of other internal prosthetic joint, initial encounter: Secondary | ICD-10-CM | POA: Diagnosis not present

## 2015-11-17 NOTE — Discharge Summary (Signed)
  Patient has been seen and examined. Patient has pain appropriate to his injury/process. Patient denies new complaints at this present time. I have discussed the care pathway with nursing staff. Patient is appropriate and alert.  We reviewed vital signs and intake output which are stable.  The upper extremity is neurovascularly intact. Refill is normal. There is no signs of compartment syndrome. There is no signs of dystrophy. There is normal sensation.  I have spent a  great deal of time discussing range of motion edema control and other techniques to decrease edema and promote flexion extension of the fingers. Patient understands the importance of elevation range of motion massage and other measures to lessen pain and prevent swelling.  We have also discussed immobilization to appropriate areas involved.  We have discussed with the patient shoulder range of motion to prevent adhesive capsulitis.  The remainder of the examination is normal today without complicating feature.   Patient will be discharged home. Will plan to see the patient back in the office as per discharge instructions (please see discharge instructions).  Patient had an uneventful hospital course. At the time of discharge patient is stable awake alert and oriented in no acute distress. Regular diet will be continued and has been tolerated. Patient will notify should have problems occur. There is no signs of DVT infection or other complication at this juncture.  All questions have been incurred and answered.  Please see discharge med list Final Dx SP revision TEA Aariah Godette MD

## 2015-11-21 ENCOUNTER — Encounter (HOSPITAL_COMMUNITY): Payer: Self-pay | Admitting: Orthopedic Surgery

## 2017-10-19 ENCOUNTER — Telehealth: Payer: Self-pay

## 2017-10-19 NOTE — Telephone Encounter (Signed)
LMOM for a return call to schedule colonoscopy.  

## 2017-10-20 NOTE — Telephone Encounter (Signed)
LMOM to call and mailing a letter also.  

## 2019-06-28 ENCOUNTER — Other Ambulatory Visit: Payer: Self-pay

## 2019-06-28 ENCOUNTER — Other Ambulatory Visit: Payer: BLUE CROSS/BLUE SHIELD

## 2019-06-28 DIAGNOSIS — Z20822 Contact with and (suspected) exposure to covid-19: Secondary | ICD-10-CM

## 2019-06-28 NOTE — Progress Notes (Signed)
[id number] 

## 2019-07-02 LAB — NOVEL CORONAVIRUS, NAA: SARS-CoV-2, NAA: NOT DETECTED

## 2019-12-18 ENCOUNTER — Ambulatory Visit: Payer: Self-pay | Attending: Internal Medicine

## 2019-12-18 ENCOUNTER — Other Ambulatory Visit: Payer: Self-pay

## 2019-12-18 DIAGNOSIS — Z20822 Contact with and (suspected) exposure to covid-19: Secondary | ICD-10-CM | POA: Insufficient documentation

## 2019-12-20 LAB — NOVEL CORONAVIRUS, NAA: SARS-CoV-2, NAA: NOT DETECTED

## 2019-12-21 ENCOUNTER — Telehealth: Payer: Self-pay

## 2019-12-21 NOTE — Telephone Encounter (Signed)
Pt returned call. He coivd results were negative and he has no other questions.   Tyler Baxter

## 2020-01-01 ENCOUNTER — Other Ambulatory Visit: Payer: Self-pay

## 2020-01-15 ENCOUNTER — Ambulatory Visit: Payer: Self-pay | Attending: Internal Medicine

## 2020-01-15 ENCOUNTER — Other Ambulatory Visit: Payer: Self-pay

## 2020-01-15 DIAGNOSIS — Z20822 Contact with and (suspected) exposure to covid-19: Secondary | ICD-10-CM | POA: Insufficient documentation

## 2020-01-16 ENCOUNTER — Other Ambulatory Visit: Payer: Self-pay

## 2020-01-16 LAB — NOVEL CORONAVIRUS, NAA: SARS-CoV-2, NAA: NOT DETECTED

## 2020-01-26 ENCOUNTER — Other Ambulatory Visit: Payer: Self-pay

## 2020-01-26 ENCOUNTER — Ambulatory Visit: Payer: Self-pay | Attending: Internal Medicine

## 2020-01-26 DIAGNOSIS — Z20822 Contact with and (suspected) exposure to covid-19: Secondary | ICD-10-CM

## 2020-01-26 DIAGNOSIS — U071 COVID-19: Secondary | ICD-10-CM | POA: Insufficient documentation

## 2020-01-27 LAB — NOVEL CORONAVIRUS, NAA: SARS-CoV-2, NAA: DETECTED — AB

## 2020-01-28 ENCOUNTER — Telehealth: Payer: Self-pay | Admitting: Nurse Practitioner

## 2020-01-28 NOTE — Telephone Encounter (Signed)
Called to discuss with Tyler Baxter about Covid symptoms and the use of bamlanivimab, a monoclonal antibody infusion for those with mild to moderate Covid symptoms and at a high risk of hospitalization.     Unable to reach patient. Voicemail left and MyChart message sent.   On chart review patient is not qualified for this infusion due to lack of identified risk factors and co-morbid conditions. However chart is limited from any recent information and warrants further discussions with patient.   Patient Active Problem List   Diagnosis Date Noted  . 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 Cousar

## 2021-09-17 ENCOUNTER — Other Ambulatory Visit: Payer: Self-pay | Admitting: Urology

## 2021-09-17 DIAGNOSIS — R972 Elevated prostate specific antigen [PSA]: Secondary | ICD-10-CM

## 2021-10-06 ENCOUNTER — Ambulatory Visit
Admission: RE | Admit: 2021-10-06 | Discharge: 2021-10-06 | Disposition: A | Discharge status: Home / Self Care | Payer: No Typology Code available for payment source | Source: Ambulatory Visit | Attending: Urology | Admitting: Urology

## 2021-10-06 DIAGNOSIS — R972 Elevated prostate specific antigen [PSA]: Secondary | ICD-10-CM

## 2021-10-06 MED ORDER — GADOBENATE DIMEGLUMINE 529 MG/ML IV SOLN
16.0000 mL | Freq: Once | INTRAVENOUS | Status: AC | PRN
  Administered 2021-10-06: 16 mL via INTRAVENOUS

## 2021-11-05 ENCOUNTER — Other Ambulatory Visit: Payer: Self-pay | Admitting: Urology

## 2021-11-27 ENCOUNTER — Encounter (HOSPITAL_BASED_OUTPATIENT_CLINIC_OR_DEPARTMENT_OTHER): Payer: Self-pay | Admitting: Urology

## 2021-11-27 ENCOUNTER — Other Ambulatory Visit: Payer: Self-pay

## 2021-11-27 NOTE — Progress Notes (Signed)
Spoke w/ via phone for pre-op interview---pt Lab needs dos----   no            Lab results------ no COVID test -----patient states asymptomatic no test needed Arrive at ------- 1145 on 12-02-2021 NPO after MN NO Solid Food.  Clear liquids from MN until--- 1045 Med rec completed Medications to take morning of surgery ----- none Diabetic medication ----- n/a Patient instructed no nail polish to be worn day of surgery Patient instructed to bring photo id and insurance card day of surgery Patient aware to have Driver (ride ) / caregiver for 24 hours after surgery --wife, Tyler Baxter Patient Special Instructions ----- n/a Pre-Op special Istructions ----- n/a Patient verbalized understanding of instructions that were given at this phone interview. Patient denies shortness of breath, chest pain, fever, cough at this phone interview.

## 2021-12-02 ENCOUNTER — Ambulatory Visit (HOSPITAL_BASED_OUTPATIENT_CLINIC_OR_DEPARTMENT_OTHER): Payer: Self-pay | Admitting: Anesthesiology

## 2021-12-02 ENCOUNTER — Ambulatory Visit (HOSPITAL_BASED_OUTPATIENT_CLINIC_OR_DEPARTMENT_OTHER)
Admission: RE | Admit: 2021-12-02 | Discharge: 2021-12-02 | Disposition: A | Discharge status: Home / Self Care | Payer: Self-pay | Attending: Urology | Admitting: Urology

## 2021-12-02 ENCOUNTER — Encounter (HOSPITAL_BASED_OUTPATIENT_CLINIC_OR_DEPARTMENT_OTHER)
Admission: RE | Disposition: A | Discharge status: Home / Self Care | Payer: Self-pay | Source: Home / Self Care | Attending: Urology

## 2021-12-02 ENCOUNTER — Encounter (HOSPITAL_BASED_OUTPATIENT_CLINIC_OR_DEPARTMENT_OTHER): Payer: Self-pay | Admitting: Urology

## 2021-12-02 ENCOUNTER — Other Ambulatory Visit: Payer: Self-pay

## 2021-12-02 DIAGNOSIS — R3912 Poor urinary stream: Secondary | ICD-10-CM | POA: Insufficient documentation

## 2021-12-02 DIAGNOSIS — Z79899 Other long term (current) drug therapy: Secondary | ICD-10-CM | POA: Insufficient documentation

## 2021-12-02 DIAGNOSIS — N138 Other obstructive and reflux uropathy: Secondary | ICD-10-CM

## 2021-12-02 DIAGNOSIS — N401 Enlarged prostate with lower urinary tract symptoms: Secondary | ICD-10-CM | POA: Insufficient documentation

## 2021-12-02 HISTORY — PX: PROSTATE SURGERY: SHX751

## 2021-12-02 HISTORY — DX: Benign prostatic hyperplasia without lower urinary tract symptoms: N40.0

## 2021-12-02 HISTORY — PX: THULIUM LASER TURP (TRANSURETHRAL RESECTION OF PROSTATE): SHX6744

## 2021-12-02 HISTORY — DX: Claustrophobia: F40.240

## 2021-12-02 HISTORY — DX: Unspecified symptoms and signs involving the genitourinary system: R39.9

## 2021-12-02 HISTORY — DX: Personal history of COVID-19: Z86.16

## 2021-12-02 HISTORY — DX: Hyperlipidemia, unspecified: E78.5

## 2021-12-02 SURGERY — THULIUM LASER TURP (TRANSURETHRAL RESECTION OF PROSTATE)
Anesthesia: General | Site: Prostate

## 2021-12-02 MED ORDER — MIDAZOLAM HCL 5 MG/5ML IJ SOLN
INTRAMUSCULAR | Status: DC | PRN
  Administered 2021-12-02: 2 mg via INTRAVENOUS

## 2021-12-02 MED ORDER — FENTANYL CITRATE (PF) 100 MCG/2ML IJ SOLN
INTRAMUSCULAR | Status: AC
  Filled 2021-12-02: qty 2

## 2021-12-02 MED ORDER — LIDOCAINE 2% (20 MG/ML) 5 ML SYRINGE
INTRAMUSCULAR | Status: DC | PRN
  Administered 2021-12-02: 60 mg via INTRAVENOUS

## 2021-12-02 MED ORDER — DEXAMETHASONE SODIUM PHOSPHATE 10 MG/ML IJ SOLN
INTRAMUSCULAR | Status: AC
  Filled 2021-12-02: qty 1

## 2021-12-02 MED ORDER — CEFAZOLIN SODIUM-DEXTROSE 2-4 GM/100ML-% IV SOLN
INTRAVENOUS | Status: AC
  Filled 2021-12-02: qty 100

## 2021-12-02 MED ORDER — PROMETHAZINE HCL 25 MG/ML IJ SOLN: 6.2500 mg | INTRAMUSCULAR | Status: DC | PRN

## 2021-12-02 MED ORDER — 0.9 % SODIUM CHLORIDE (POUR BTL) OPTIME
TOPICAL | Status: DC | PRN
  Administered 2021-12-02: 14:00:00 500 mL

## 2021-12-02 MED ORDER — PROPOFOL 10 MG/ML IV BOLUS
INTRAVENOUS | Status: DC | PRN
  Administered 2021-12-02: 200 mg via INTRAVENOUS

## 2021-12-02 MED ORDER — LIDOCAINE 2% (20 MG/ML) 5 ML SYRINGE
INTRAMUSCULAR | Status: AC
  Filled 2021-12-02: qty 5

## 2021-12-02 MED ORDER — PROPOFOL 10 MG/ML IV BOLUS
INTRAVENOUS | Status: AC
  Filled 2021-12-02: qty 20

## 2021-12-02 MED ORDER — CELECOXIB 200 MG PO CAPS
200.0000 mg | ORAL_CAPSULE | Freq: Once | ORAL | Status: AC
  Administered 2021-12-02: 12:00:00 200 mg via ORAL

## 2021-12-02 MED ORDER — ACETAMINOPHEN 500 MG PO TABS
ORAL_TABLET | ORAL | Status: AC
  Filled 2021-12-02: qty 2

## 2021-12-02 MED ORDER — LACTATED RINGERS IV SOLN: INTRAVENOUS | Status: DC

## 2021-12-02 MED ORDER — FENTANYL CITRATE (PF) 100 MCG/2ML IJ SOLN
INTRAMUSCULAR | Status: DC | PRN
  Administered 2021-12-02: 50 ug via INTRAVENOUS

## 2021-12-02 MED ORDER — OXYCODONE HCL 5 MG/5ML PO SOLN: 5.0000 mg | Freq: Once | ORAL | Status: DC | PRN

## 2021-12-02 MED ORDER — OXYCODONE HCL 5 MG PO TABS: 5.0000 mg | ORAL_TABLET | Freq: Once | ORAL | Status: DC | PRN

## 2021-12-02 MED ORDER — FENTANYL CITRATE (PF) 100 MCG/2ML IJ SOLN
25.0000 ug | INTRAMUSCULAR | Status: DC | PRN
  Administered 2021-12-02: 15:00:00 50 ug via INTRAVENOUS

## 2021-12-02 MED ORDER — CEFAZOLIN SODIUM-DEXTROSE 2-4 GM/100ML-% IV SOLN
2.0000 g | Freq: Once | INTRAVENOUS | Status: AC
  Administered 2021-12-02: 14:00:00 2 g via INTRAVENOUS

## 2021-12-02 MED ORDER — MIDAZOLAM HCL 2 MG/2ML IJ SOLN
INTRAMUSCULAR | Status: AC
  Filled 2021-12-02: qty 2

## 2021-12-02 MED ORDER — CELECOXIB 200 MG PO CAPS
ORAL_CAPSULE | ORAL | Status: AC
  Filled 2021-12-02: qty 1

## 2021-12-02 MED ORDER — EPHEDRINE SULFATE-NACL 50-0.9 MG/10ML-% IV SOSY
PREFILLED_SYRINGE | INTRAVENOUS | Status: DC | PRN
  Administered 2021-12-02 (×3): 10 mg via INTRAVENOUS

## 2021-12-02 MED ORDER — ACETAMINOPHEN 500 MG PO TABS
1000.0000 mg | ORAL_TABLET | Freq: Once | ORAL | Status: AC
  Administered 2021-12-02: 12:00:00 1000 mg via ORAL

## 2021-12-02 MED ORDER — DEXAMETHASONE SODIUM PHOSPHATE 10 MG/ML IJ SOLN
INTRAMUSCULAR | Status: DC | PRN
  Administered 2021-12-02: 10 mg via INTRAVENOUS

## 2021-12-02 MED ORDER — EPHEDRINE 5 MG/ML INJ
INTRAVENOUS | Status: AC
  Filled 2021-12-02: qty 5

## 2021-12-02 MED ORDER — ONDANSETRON HCL 4 MG/2ML IJ SOLN
INTRAMUSCULAR | Status: DC | PRN
  Administered 2021-12-02: 4 mg via INTRAVENOUS

## 2021-12-02 MED ORDER — ONDANSETRON HCL 4 MG/2ML IJ SOLN
INTRAMUSCULAR | Status: AC
  Filled 2021-12-02: qty 2

## 2021-12-02 MED ORDER — SODIUM CHLORIDE 0.9 % IR SOLN
Status: DC | PRN
  Administered 2021-12-02: 4500 mL
  Administered 2021-12-02: 6000 mL

## 2021-12-02 SURGICAL SUPPLY — 34 items
BAG DRAIN URO-CYSTO SKYTR STRL (DRAIN) ×3 IMPLANT
BAG DRN RND TRDRP ANRFLXCHMBR (UROLOGICAL SUPPLIES) ×1
BAG DRN UROCATH (DRAIN) ×1
BAG URINE DRAIN 2000ML AR STRL (UROLOGICAL SUPPLIES) ×3 IMPLANT
BAG URINE LEG 500ML (DRAIN) IMPLANT
CATH FOLEY 2WAY SLVR  5CC 20FR (CATHETERS) ×3
CATH FOLEY 2WAY SLVR 5CC 20FR (CATHETERS) IMPLANT
CATH FOLEY 3WAY 30CC 22FR (CATHETERS) ×1 IMPLANT
CATH HEMA 3WAY 30CC 24FR COUDE (CATHETERS) IMPLANT
CATH HEMA 3WAY 30CC 24FR RND (CATHETERS) IMPLANT
CLOTH BEACON ORANGE TIMEOUT ST (SAFETY) ×3 IMPLANT
ELECT REM PT RETURN 9FT ADLT (ELECTROSURGICAL)
ELECTRODE REM PT RTRN 9FT ADLT (ELECTROSURGICAL) ×1 IMPLANT
EVACUATOR MICROVAS BLADDER (UROLOGICAL SUPPLIES) IMPLANT
GLOVE SURG ENC MOIS LTX SZ7.5 (GLOVE) ×3 IMPLANT
GLOVE SURG ENC MOIS LTX SZ8 (GLOVE) IMPLANT
GOWN STRL REUS W/TWL LRG LVL3 (GOWN DISPOSABLE) ×3 IMPLANT
HOLDER FOLEY CATH W/STRAP (MISCELLANEOUS) ×2 IMPLANT
IV NS IRRIG 3000ML ARTHROMATIC (IV SOLUTION) ×8 IMPLANT
KIT TURNOVER CYSTO (KITS) ×3 IMPLANT
LASER REVOLIX HI ENERGY 1000 (Laser) ×2 IMPLANT
LASER REVOLIX PROCEDURE (MISCELLANEOUS) ×2 IMPLANT
LOOP CUT BIPOLAR 24F LRG (ELECTROSURGICAL) ×1 IMPLANT
MANIFOLD NEPTUNE II (INSTRUMENTS) ×3 IMPLANT
NS IRRIG 500ML POUR BTL (IV SOLUTION) ×2 IMPLANT
PACK CYSTO (CUSTOM PROCEDURE TRAY) ×3 IMPLANT
PLUG CATH AND CAP STER (CATHETERS) IMPLANT
SYR 30ML LL (SYRINGE) ×3 IMPLANT
SYR TOOMEY IRRIG 70ML (MISCELLANEOUS) ×3
SYRINGE TOOMEY IRRIG 70ML (MISCELLANEOUS) ×1 IMPLANT
TUBE CONNECTING 12'X1/4 (SUCTIONS) ×1
TUBE CONNECTING 12X1/4 (SUCTIONS) ×2 IMPLANT
TUBING UROLOGY SET (TUBING) ×3 IMPLANT
WATER STERILE IRR 500ML POUR (IV SOLUTION) ×2 IMPLANT

## 2021-12-02 NOTE — Anesthesia Preprocedure Evaluation (Addendum)
Anesthesia Evaluation  Patient identified by MRN, date of birth, ID band Patient awake    Reviewed: Allergy & Precautions, NPO status , Patient's Chart, lab work & pertinent test results  History of Anesthesia Complications Negative for: history of anesthetic complications  Airway Mallampati: II  TM Distance: >3 FB Neck ROM: Full    Dental  (+) Dental Advisory Given, Teeth Intact   Pulmonary neg pulmonary ROS,    Pulmonary exam normal        Cardiovascular negative cardio ROS Normal cardiovascular exam     Neuro/Psych PSYCHIATRIC DISORDERS Anxiety negative neurological ROS     GI/Hepatic negative GI ROS, Neg liver ROS,   Endo/Other  negative endocrine ROS  Renal/GU negative Renal ROS    BPH     Musculoskeletal  (+) Arthritis ,   Abdominal   Peds  Hematology negative hematology ROS (+)   Anesthesia Other Findings   Reproductive/Obstetrics                            Anesthesia Physical Anesthesia Plan  ASA: 1  Anesthesia Plan: General   Post-op Pain Management: Tylenol PO (pre-op) and Celebrex PO (pre-op)   Induction: Intravenous  PONV Risk Score and Plan: 2 and Treatment may vary due to age or medical condition, Ondansetron, Dexamethasone and Midazolam  Airway Management Planned: LMA  Additional Equipment: None  Intra-op Plan:   Post-operative Plan: Extubation in OR  Informed Consent: I have reviewed the patients History and Physical, chart, labs and discussed the procedure including the risks, benefits and alternatives for the proposed anesthesia with the patient or authorized representative who has indicated his/her understanding and acceptance.       Plan Discussed with: CRNA and Anesthesiologist  Anesthesia Plan Comments:        Anesthesia Quick Evaluation

## 2021-12-02 NOTE — H&P (Addendum)
H&P  Chief Complaint: BPH, lower urinary tract symptoms  History of Present Illness: Tyler Baxter is a 60 year old male with a history of BPH and progressive lower urinary tract symptoms.  He has had a weak stream.  Has been on tamsulosin.  Cystoscopy revealed BPH and bladder outlet obstruction.  Patient elected to proceed with surgical treatment.  He reports he has had about 2 years of painful voiding and straining to void.  The tamsulosin helps to give him a better stream and he notices if he misses a dose, but would like to void better and get off the medication.  He has a history of PSA elevation. A biopsy in 2011 and 2012 are benign had a PSA of 8.  His September 2022 PSA rose to 11.9 (psad 0.17 and 0.3 / year) and therefore he underwent a MRI of the prostate which was benign October 2022 with a 72 g prostate noted.   He presents today for thulium laser vaporization of the prostate.  He has been well without dysuria, gross hematuria or fever.    Past Medical History:  Diagnosis Date   Arthritis    BPH (benign prostatic hyperplasia)    Claustrophobia    Family history of adverse reaction to anesthesia    mother -- ponv   History of COVID-19    01-26-2020 positive result in epic, per pt mild symptoms that resolved ;  and positive 08/ 2021 with no symptoms   Hyperlipidemia    Lower urinary tract symptoms (LUTS)    Past Surgical History:  Procedure Laterality Date   ELBOW SURGERY Bilateral    2006 and 2007   TOTAL ELBOW ARTHROPLASTY Bilateral    right 12-01-2007;  left 03-08-2008 both @[facility]  by dr Amedeo Plenty   TOTAL ELBOW ARTHROPLASTY Right 11/14/2015   Procedure: RIGHT TOTAL ELBOW REVISION  ARTHROPLASTY ;  Surgeon: Roseanne Kaufman, MD;  Location: [facility];  Service: Orthopedics;  Laterality: Right;    Home Medications:  No medications prior to admission.   Allergies: No Known Allergies  No family history on file. Social History:  reports that he has never smoked. He has never used smokeless  tobacco. He reports that he does not drink alcohol and does not use drugs.  ROS: A complete review of systems was performed.  All systems are negative except for pertinent findings as noted. Review of Systems  All other systems reviewed and are negative.   Physical Exam:  Vital signs in last 24 hours:   General:  Alert and oriented, No acute distress HEENT: Normocephalic, atraumatic Cardiovascular: Regular rate and rhythm Lungs: Regular rate and effort Abdomen: Soft, nontender, nondistended, no abdominal masses Back: No CVA tenderness Extremities: No edema Neurologic: Grossly intact  Laboratory Data:  No results found for this or any previous visit (from the past 24 hour(s)). No results found for this or any previous visit (from the past 240 hour(s)). Creatinine: No results for input(s): CREATININE in the last 168 hours.  Impression/Assessment:  BPH, lower urinary tract symptoms-  Plan:  I discussed with the patient the nature, potential benefits, risks and alternatives to thulium laser vaporization of the prostate, including side effects of the proposed treatment, the likelihood of the patient achieving the goals of the procedure, and any potential problems that might occur during the procedure or recuperation. We discussed how LVP might affect him sexually and with RGE among other risks. We discussed the postop Foley catheter.  He has follow-up in a week but if his urine is  clear Friday or Saturday morning I do not mind if he removes the catheter at home.  He will consider it.  His daughter is a Engineer, civil (consulting). All questions answered. Patient elects to proceed.    Jerilee Field 12/02/2021

## 2021-12-02 NOTE — Transfer of Care (Signed)
Immediate Anesthesia Transfer of Care Note  Patient: Tyler Baxter  Procedure(s) Performed: Morton Peters LASER TURP (TRANSURETHRAL RESECTION OF PROSTATE) (Prostate)  Patient Location: PACU  Anesthesia Type:General  Level of Consciousness: drowsy  Airway & Oxygen Therapy: Patient Spontanous Breathing and Patient connected to nasal cannula oxygen  Post-op Assessment: Report given to RN  Post vital signs: Reviewed and stable  Last Vitals:  Vitals Value Taken Time  BP 114/79 12/02/21 1500  Temp    Pulse 70 12/02/21 1500  Resp 14 12/02/21 1500  SpO2 99 % 12/02/21 1500  Vitals shown include unvalidated device data.  Last Pain:  Vitals:   12/02/21 1201  TempSrc: Oral  PainSc: 0-No pain      Patients Stated Pain Goal: 3 (12/02/21 1201)  Complications: No notable events documented.

## 2021-12-02 NOTE — Anesthesia Postprocedure Evaluation (Signed)
Anesthesia Post Note  Patient: Tyler Baxter  Procedure(s) Performed: Morton Peters LASER TURP (TRANSURETHRAL RESECTION OF PROSTATE) (Prostate)     Patient location during evaluation: PACU Anesthesia Type: General Level of consciousness: awake and alert and oriented Pain management: pain level controlled Vital Signs Assessment: post-procedure vital signs reviewed and stable Respiratory status: spontaneous breathing, nonlabored ventilation and respiratory function stable Cardiovascular status: blood pressure returned to baseline Postop Assessment: no apparent nausea or vomiting Anesthetic complications: no   No notable events documented.  Last Vitals:  Vitals:   12/02/21 1600 12/02/21 1615  BP: 115/79 114/84  Pulse: 70 74  Resp: 15 20  Temp:  (!) 36 C  SpO2: 97% 97%    Last Pain:  Vitals:   12/02/21 1615  TempSrc:   PainSc: 2                  Shanda Howells

## 2021-12-02 NOTE — Anesthesia Procedure Notes (Signed)
Procedure Name: LMA Insertion Date/Time: 12/02/2021 1:58 PM Performed by: Briant Sites, CRNA Pre-anesthesia Checklist: Patient identified, Emergency Drugs available, Suction available and Patient being monitored Patient Re-evaluated:Patient Re-evaluated prior to induction Oxygen Delivery Method: Circle system utilized Preoxygenation: Pre-oxygenation with 100% oxygen Induction Type: IV induction Ventilation: Mask ventilation without difficulty LMA: LMA inserted LMA Size: 5.0 Number of attempts: 1 Airway Equipment and Method: Bite block Placement Confirmation: positive ETCO2 Tube secured with: Tape Dental Injury: Teeth and Oropharynx as per pre-operative assessment

## 2021-12-02 NOTE — Discharge Instructions (Addendum)
If your urine is clear and you desire you could remove the catheter early on Friday or Saturday morning December 23 or 24 as instructed.  Note - there is 25 mL in the balloon.  Make sure all the fluid is out of the balloon before you remove it.   Post Anesthesia Home Care Instructions  Activity: Get plenty of rest for the remainder of the day. A responsible individual must stay with you for 24 hours following the procedure.  For the next 24 hours, DO NOT: -Drive a car -Advertising copywriter -Drink alcoholic beverages -Take any medication unless instructed by your physician -Make any legal decisions or sign important papers.  Meals: Start with liquid foods such as gelatin or soup. Progress to regular foods as tolerated. Avoid greasy, spicy, heavy foods. If nausea and/or vomiting occur, drink only clear liquids until the nausea and/or vomiting subsides. Call your physician if vomiting continues.  Special Instructions/Symptoms: Your throat may feel dry or sore from the anesthesia or the breathing tube placed in your throat during surgery. If this causes discomfort, gargle with warm salt water. The discomfort should disappear within 24 hours.   May take Tylenol as needed for pain starting at 6:30 PM. May take Ibuprofen as needed starting at 10 PM.

## 2021-12-02 NOTE — Op Note (Signed)
Preoperative diagnosis: BPH, lower urinary tract symptoms, weak stream Postoperative diagnosis: Same  Procedure: Thulium laser vaporization of the prostate  Surgeon: Tyler Baxter  Anesthesia: General  Indication for procedure: Tyler Baxter is a 60 year old male with a history of BPH.  He has had painful voiding and a weak stream for a few years.  Tamsulosin really helps but he wanted more definitive treatment and to get off medication.  Findings: On exam under anesthesia the penis was circumcised without mass or lesion.  Testicles descended bilaterally and palpably normal on DRE prostate was about 50 g and smooth without hard area or nodule.  On cystoscopy the prostate was obstructed by lateral and median lobe hypertrophy.  Bladder appeared normal.  No stone or foreign body in the bladder.  Trigone and ureteral orifice ease visualized.  Large serpiginous veins on the right bladder neck and trigone.  Large capacity bladder.  Description of procedure: After consent was obtained patient brought to the operating room.  After adequate anesthesia was placed lithotomy position and prepped and draped in the usual sterile fashion.  Timeout was performed confirming patient and procedure.  Cystoscope was passed per urethra and the bladder inspected.  I then exchanged that for the laser continuous-flow sheath in the 1000 m thulium laser fiber advanced.  The ureteral orifice ease identified.  Incision was made at the left bladder neck in line from left UO down to the verumontanum.  Similar incision at 7:00 on the right UO line down to the verumontanum.  Median lobe was then vaporized after energy increased 150.  Left lateral lobe vaporized from bladder neck to the verumontanum and then the right lateral lobe was vaporized.  The bladder was emptied and some residual lateral lobe tissue was vaporized bilaterally.  No vaporization was carried out distal to the verumontanum.  This created an excellent channel.  Hemostasis was  excellent at low pressure.  The bladder was filled and the scope removed.  A 20 [place] two-way catheter was advanced left to gravity drainage with about 25 cc in the balloon.  Irrigation was clear.  Exam under anesthesia performed.  He was awakened taken the cover room in stable condition.  Complications: None  Blood loss: Minimal  Specimens: None  Drains: 20 [place] two-way catheter  Disposition: Patient stable to PACU

## 2021-12-09 ENCOUNTER — Encounter (HOSPITAL_BASED_OUTPATIENT_CLINIC_OR_DEPARTMENT_OTHER): Payer: Self-pay | Admitting: Urology

## 2022-06-18 IMAGING — MR MR PROSTATE WO/W CM
12 series · 48 of 48 positions shown · IV contrast (multihance)
Comparison: None.

CLINICAL DATA: 60-year-old male with elevated PSA equal

EXAM:
MR PROSTATE WITHOUT AND WITH CONTRAST
TECHNIQUE: Multiplanar multisequence MRI images were obtained of the pelvis
centered about the prostate. Pre and post contrast images were
obtained.
CONTRAST:  16mL MULTIHANCE GADOBENATE DIMEGLUMINE 529 MG/ML IV SOLN

[Series 3: T2 · coronal · 3.0mm · 0.56mm/px · 1 of 23 slices shown (1 of 3)]
[im 1/23]
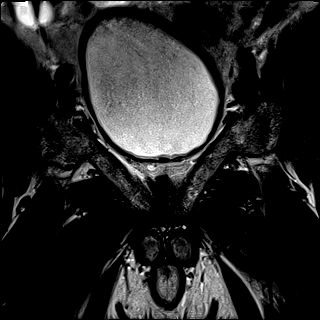

[Series 4: T1 · axial · 5.0mm · 1.25mm/px · 1 of 80 slices shown]
[im 1/80]
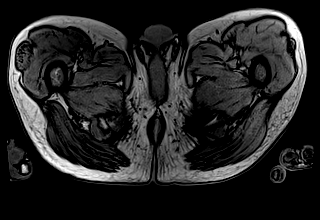

[Series 5: DWI · axial · 3.0mm · 1.75mm/px · 1 of 75 slices shown (1 of 3)]
[im 1/75]
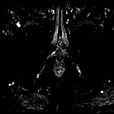

[Series 6: DWI · axial · 3.0mm · 1.75mm/px · 1 of 25 slices shown (2 of 3)]
[im 1/25]
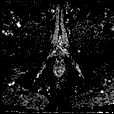

[Series 7: DWI · axial · 3.0mm · 1.75mm/px · 1 of 25 slices shown (3 of 3)]
[im 1/25]
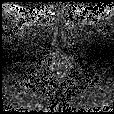

[Series 8: T2 · axial · 3.0mm · 0.56mm/px · 1 of 25 slices shown (2 of 3)]
[im 1/25]
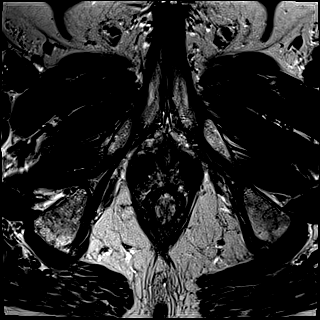

[Series 9: T2 · axial · 1.0mm · 1.04mm/px · z∈[-91,-20]mm · 2 of 72 slices shown (3 of 3)]
[im 1/72]
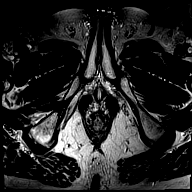
[im 72/72]
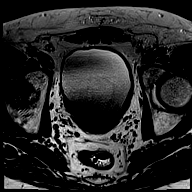

[Series 10: pre t1_twist_tra_dyn · axial · non-contrast · 3.5mm · 0.83mm/px · 1 of 22 slices shown]
[im 1/22]
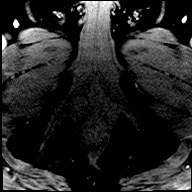

[Series 11: post t1_twist_tra_dyn-copy center · axial · non-contrast · 3.5mm · 0.83mm/px · z∈[-92,-19]mm · 18 of 660 slices shown]
[im 1/660]
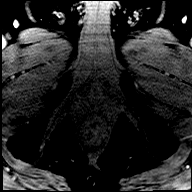
[im 39/660]
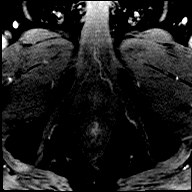
[im 78/660]
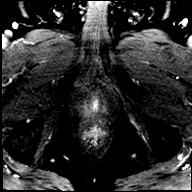
[im 117/660]
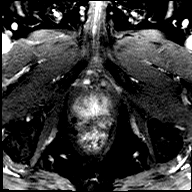
[im 156/660]
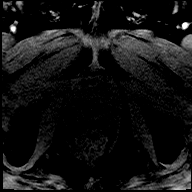
[im 194/660]
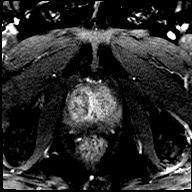
[im 233/660]
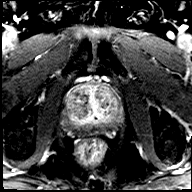
[im 272/660]
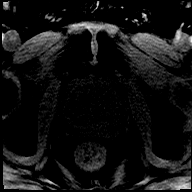
[im 311/660]
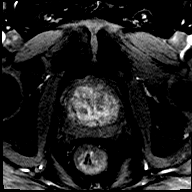
[im 349/660]
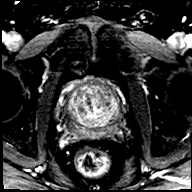
[im 388/660]
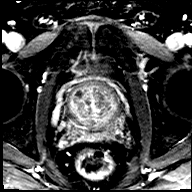
[im 427/660]
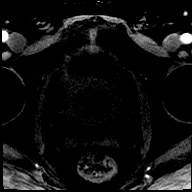
[im 466/660]
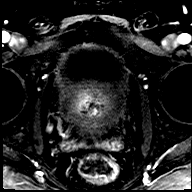
[im 504/660]
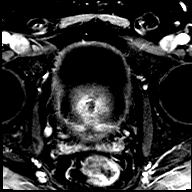
[im 543/660]
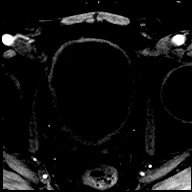
[im 582/660]
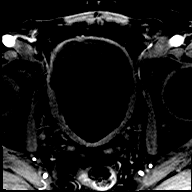
[im 621/660]
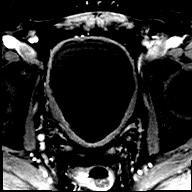
[im 660/660]
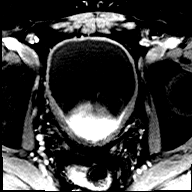

[Series 12: post t1_twist_tra_dyn-copy cent_sub · axial · 3.5mm · 0.83mm/px · z∈[-92,-19]mm · 17 of 629 slices shown]
[im 1/629]
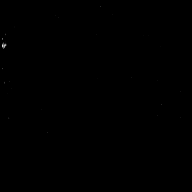
[im 40/629]
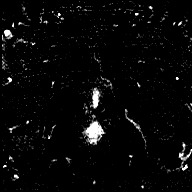
[im 79/629]
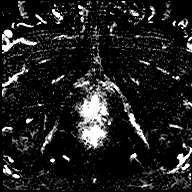
[im 118/629]
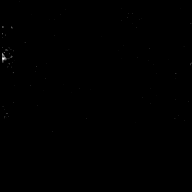
[im 158/629]
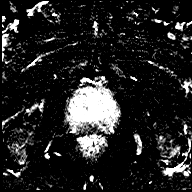
[im 197/629]
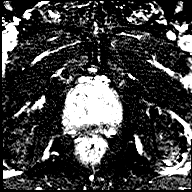
[im 236/629]
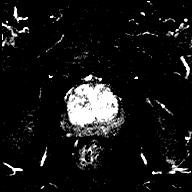
[im 275/629]
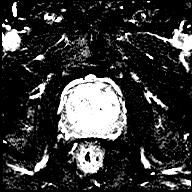
[im 315/629]
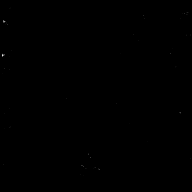
[im 354/629]
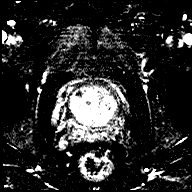
[im 393/629]
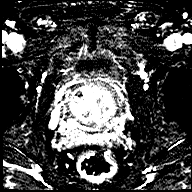
[im 432/629]
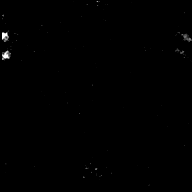
[im 472/629]
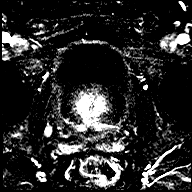
[im 511/629]
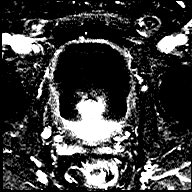
[im 550/629]
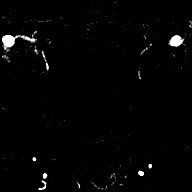
[im 589/629]
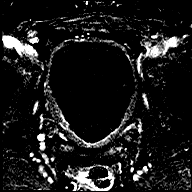
[im 629/629]
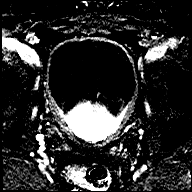

[Series 13: t1_vibe_dixon_tra_f · axial · 2.5mm · 0.91mm/px · z∈[-119,+78]mm · 2 of 80 slices shown]
[im 1/80]
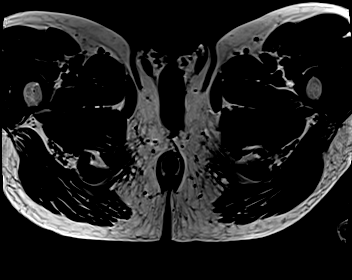
[im 80/80]
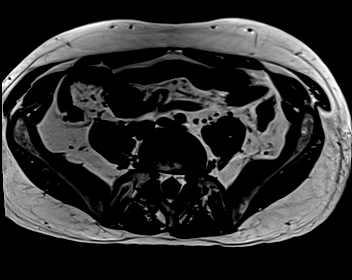

[Series 14: t1_vibe_dixon_tra_w · axial · 2.5mm · 0.91mm/px · z∈[-119,+78]mm · 2 of 80 slices shown]
[im 1/80]
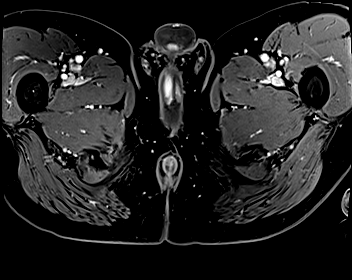
[im 80/80]
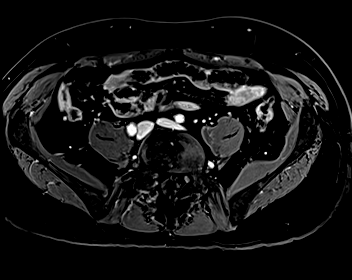

[48 of 48 positions shown; findings below may reference images not displayed]

FINDINGS: Prostate: No foci of restricted diffusion within the peripheral
zone. No suspicious lesions within peripheral zone on T2 weighted
imaging (series 8).

There is a round focus of absent signal within the RIGHT gland
(image [DATE]) which is favored a calcification or metallic artifact.
Absence of signal in all sequences suggest artifact.

The transitional zone is enlarged by well capsulated nodules without
suspicious imaging findings on T2 weighted imaging. activity

Volume: 5.1 x 4.5 x 6.0 cm (volume = 72 cm^3)

Transcapsular spread:  Absent

Seminal vesicle involvement: Absent

Neurovascular bundle involvement: Absent

Pelvic adenopathy: Absent

Bone metastasis: Absent

Other findings: None
IMPRESSION: 1. No high-grade carcinoma in peripheral zone.  PI-RADS: 1
2. Enlarged nodular transitional zone most consistent with benign
prostate hypertrophy. PI-RADS: 2

## 2023-07-13 ENCOUNTER — Other Ambulatory Visit: Payer: Self-pay | Admitting: Urology

## 2023-07-13 DIAGNOSIS — R972 Elevated prostate specific antigen [PSA]: Secondary | ICD-10-CM

## 2023-08-09 NOTE — Pre-Procedure Instructions (Signed)
Surgical Instructions   Your procedure is scheduled on Tuesday, September 10th. Report to [facility] Main Entrance "A" at 05:30 A.M., then check in with the Admitting office. Any questions or running late day of surgery: call [phone]  Questions prior to your surgery date: call [phone], Monday-Friday, 8am-4pm. If you experience any cold or flu symptoms such as cough, fever, chills, shortness of breath, etc. between now and your scheduled surgery, please notify us at the above number.     Remember:  Do not eat after midnight the night before your surgery  You may drink clear liquids until 04:30 AM the morning of your surgery.   Clear liquids allowed are: Water, Non-Citrus Juices (without pulp), Carbonated Beverages, Clear Tea, Black Coffee Only (NO MILK, CREAM OR POWDERED CREAMER of any kind), and Gatorade.  Patient Instructions  The night before surgery:  No food after midnight. ONLY clear liquids after midnight  The day of surgery (if you do NOT have diabetes):  Drink ONE (1) Pre-Surgery Clear Ensure by 04:30 AM the morning of surgery. Drink in one sitting. Do not sip.  This drink was given to you during your hospital  pre-op appointment visit.  Nothing else to drink after completing the  Pre-Surgery Clear Ensure.          If you have questions, please contact your surgeon's office.    Take these medicines the morning of surgery with A SIP OF WATER  Polyethyl Glycol-Propyl Glycol (SYSTANE)     One week prior to surgery, STOP taking any Aspirin (unless otherwise instructed by your surgeon) Aleve, Naproxen, Ibuprofen, Motrin, Advil, Goody's, BC's, all herbal medications, fish oil, and non-prescription vitamins.                     Do NOT Smoke (Tobacco/Vaping) for 24 hours prior to your procedure.  If you use a CPAP at night, you may bring your mask/headgear for your overnight stay.   You will be asked to remove any contacts, glasses, piercing's, hearing aid's,  dentures/partials prior to surgery. Please bring cases for these items if needed.    Patients discharged the day of surgery will not be allowed to drive home, and someone needs to stay with them for 24 hours.  SURGICAL WAITING ROOM VISITATION Patients may have no more than 2 support people in the waiting area - these visitors may rotate.   Pre-op nurse will coordinate an appropriate time for 1 ADULT support person, who may not rotate, to accompany patient in pre-op.  Children under the age of 60 must have an adult with them who is not the patient and must remain in the main waiting area with an adult.  If the patient needs to stay at the hospital during part of their recovery, the visitor guidelines for inpatient rooms apply.  Please refer to the [facility] website for the visitor guidelines for any additional information.   If you received a COVID test during your pre-op visit  it is requested that you wear a mask when out in public, stay away from anyone that may not be feeling well and notify your surgeon if you develop symptoms. If you have been in contact with anyone that has tested positive in the last 10 days please notify you surgeon.      Pre-operative 5 CHG Bathing Instructions   You can play a key role in reducing the risk of infection after surgery. Your skin needs to be as free of germs  as possible. You can reduce the number of germs on your skin by washing with CHG (chlorhexidine gluconate) soap before surgery. CHG is an antiseptic soap that kills germs and continues to kill germs even after washing.   DO NOT use if you have an allergy to chlorhexidine/CHG or antibacterial soaps. If your skin becomes reddened or irritated, stop using the CHG and notify one of our RNs at [phone].   Please shower with the CHG soap starting 4 days before surgery using the following schedule:     Please keep in mind the following:  DO NOT shave, including legs and underarms, starting the  day of your first shower.   You may shave your face at any point before/day of surgery.  Place clean sheets on your bed the day you start using CHG soap. Use a clean washcloth (not used since being washed) for each shower. DO NOT sleep with pets once you start using the CHG.   CHG Shower Instructions:  If you choose to wash your hair and private area, wash first with your normal shampoo/soap.  After you use shampoo/soap, rinse your hair and body thoroughly to remove shampoo/soap residue.  Turn the water OFF and apply about 3 tablespoons (45 ml) of CHG soap to a CLEAN washcloth.  Apply CHG soap ONLY FROM YOUR NECK DOWN TO YOUR TOES (washing for 3-5 minutes)  DO NOT use CHG soap on face, private areas, open wounds, or sores.  Pay special attention to the area where your surgery is being performed.  If you are having back surgery, having someone wash your back for you may be helpful. Wait 2 minutes after CHG soap is applied, then you may rinse off the CHG soap.  Pat dry with a clean towel  Put on clean clothes/pajamas   If you choose to wear lotion, please use ONLY the CHG-compatible lotions on the back of this paper.   Additional instructions for the day of surgery: DO NOT APPLY any lotions, deodorants, cologne, or perfumes.   Do not bring valuables to the hospital. [facility] is not responsible for any belongings/valuables. Do not wear nail polish, gel polish, artificial nails, or any other type of covering on natural nails (fingers and toes) Do not wear jewelry or makeup Put on clean/comfortable clothes.  Please brush your teeth.  Ask your nurse before applying any prescription medications to the skin.     CHG Compatible Lotions   Aveeno Moisturizing lotion  Cetaphil Moisturizing Cream  Cetaphil Moisturizing Lotion  Clairol Herbal Essence Moisturizing Lotion, Dry Skin  Clairol Herbal Essence Moisturizing Lotion, Extra Dry Skin  Clairol Herbal Essence Moisturizing Lotion, Normal  Skin  Curel Age Defying Therapeutic Moisturizing Lotion with Alpha Hydroxy  Curel Extreme Care Body Lotion  Curel Soothing Hands Moisturizing Hand Lotion  Curel Therapeutic Moisturizing Cream, Fragrance-Free  Curel Therapeutic Moisturizing Lotion, Fragrance-Free  Curel Therapeutic Moisturizing Lotion, Original Formula  Eucerin Daily Replenishing Lotion  Eucerin Dry Skin Therapy Plus Alpha Hydroxy Crme  Eucerin Dry Skin Therapy Plus Alpha Hydroxy Lotion  Eucerin Original Crme  Eucerin Original Lotion  Eucerin Plus Crme Eucerin Plus Lotion  Eucerin TriLipid Replenishing Lotion  Keri Anti-Bacterial Hand Lotion  Keri Deep Conditioning Original Lotion Dry Skin Formula Softly Scented  Keri Deep Conditioning Original Lotion, Fragrance Free Sensitive Skin Formula  Keri Lotion Fast Absorbing Fragrance Free Sensitive Skin Formula  Keri Lotion Fast Absorbing Softly Scented Dry Skin Formula  Keri Original Lotion  Keri Skin Renewal Lotion [place]  Smooth Lotion  Keri Silky Smooth Sensitive Skin Lotion  Nivea Body Creamy Conditioning Oil  Nivea Body Extra Enriched Lotion  Nivea Body Original Lotion  Nivea Body Sheer Moisturizing Lotion Nivea Crme  Nivea Skin Firming Lotion  NutraDerm 30 Skin Lotion  NutraDerm Skin Lotion  NutraDerm Therapeutic Skin Cream  NutraDerm Therapeutic Skin Lotion  ProShield Protective Hand Cream  Provon moisturizing lotion  Please read over the following fact sheets that you were given.

## 2023-08-10 ENCOUNTER — Encounter (HOSPITAL_COMMUNITY)
Admission: RE | Admit: 2023-08-10 | Discharge: 2023-08-10 | Disposition: A | Discharge status: Home / Self Care | Payer: Self-pay | Source: Ambulatory Visit | Attending: Orthopedic Surgery | Admitting: Orthopedic Surgery

## 2023-08-10 ENCOUNTER — Other Ambulatory Visit: Payer: Self-pay

## 2023-08-10 ENCOUNTER — Encounter (HOSPITAL_COMMUNITY): Payer: Self-pay

## 2023-08-10 VITALS — BP 113/82 | HR 59 | Temp 98.4°F | Resp 17 | Ht 70.0 in | Wt 176.7 lb

## 2023-08-10 DIAGNOSIS — Z96622 Presence of left artificial elbow joint: Secondary | ICD-10-CM | POA: Insufficient documentation

## 2023-08-10 DIAGNOSIS — Z01818 Encounter for other preprocedural examination: Secondary | ICD-10-CM

## 2023-08-10 DIAGNOSIS — Z01812 Encounter for preprocedural laboratory examination: Secondary | ICD-10-CM | POA: Insufficient documentation

## 2023-08-10 LAB — CBC
HCT: 45.1 % (ref 39.0–52.0)
Hemoglobin: 15.6 g/dL (ref 13.0–17.0)
MCH: 31.7 pg (ref 26.0–34.0)
MCHC: 34.6 g/dL (ref 30.0–36.0)
MCV: 91.7 fL (ref 80.0–100.0)
Platelets: 221 10*3/uL (ref 150–400)
RBC: 4.92 MIL/uL (ref 4.22–5.81)
RDW: 13.1 % (ref 11.5–15.5)
WBC: 5.4 10*3/uL (ref 4.0–10.5)
nRBC: 0 % (ref 0.0–0.2)

## 2023-08-10 LAB — BASIC METABOLIC PANEL
Anion gap: 9 (ref 5–15)
BUN: 13 mg/dL (ref 8–23)
CO2: 21 mmol/L — ABNORMAL LOW (ref 22–32)
Calcium: 8.7 mg/dL — ABNORMAL LOW (ref 8.9–10.3)
Chloride: 105 mmol/L (ref 98–111)
Creatinine, Ser: 0.82 mg/dL (ref 0.61–1.24)
GFR, Estimated: >60 mL/min (ref >60)
Glucose, Bld: 110 mg/dL — ABNORMAL HIGH (ref 70–99)
Potassium: 4.1 mmol/L (ref 3.5–5.1)
Sodium: 135 mmol/L (ref 135–145)

## 2023-08-10 LAB — TYPE AND SCREEN
ABO/RH(D): A POS
Antibody Screen: NEGATIVE

## 2023-08-10 NOTE — Progress Notes (Signed)
PCP - Assunta Found, MD Cardiologist - Denies  PPM/ICD - Denies  Chest x-ray - Denies EKG - Denies Stress Test - Denies ECHO - Denies Cardiac Cath - Denies  Sleep Study - Denies  DM: Denies  Blood Thinner Instructions: N/A Aspirin Instructions:N/A  ERAS Protcol - Yes PRE-SURGERY Ensure or G2- Ensure  COVID TEST- No   Anesthesia review: No  Patient denies shortness of breath, fever, cough and chest pain at PAT appointment   All instructions explained to the patient, with a verbal understanding of the material. Patient agrees to go over the instructions while at home for a better understanding.The opportunity to ask questions was provided.

## 2023-08-15 ENCOUNTER — Ambulatory Visit
Admission: RE | Admit: 2023-08-15 | Discharge: 2023-08-15 | Disposition: A | Discharge status: Home / Self Care | Payer: Self-pay | Source: Ambulatory Visit | Attending: Urology | Admitting: Urology

## 2023-08-15 DIAGNOSIS — R972 Elevated prostate specific antigen [PSA]: Secondary | ICD-10-CM

## 2023-08-15 MED ORDER — GADOPICLENOL 0.5 MMOL/ML IV SOLN
8.0000 mL | Freq: Once | INTRAVENOUS | Status: AC | PRN
  Administered 2023-08-15: 8 mL via INTRAVENOUS

## 2023-08-24 ENCOUNTER — Other Ambulatory Visit: Payer: Self-pay

## 2023-08-24 ENCOUNTER — Ambulatory Visit (HOSPITAL_COMMUNITY): Payer: Self-pay

## 2023-08-24 ENCOUNTER — Encounter (HOSPITAL_COMMUNITY)
Admission: RE | Disposition: A | Discharge status: Home / Self Care | Payer: Self-pay | Source: Home / Self Care | Attending: Orthopedic Surgery

## 2023-08-24 ENCOUNTER — Inpatient Hospital Stay (HOSPITAL_COMMUNITY)
Admission: RE | Admit: 2023-08-24 | Discharge: 2023-08-26 | DRG: 483 | Disposition: A | Discharge status: Home / Self Care | Payer: Self-pay | Attending: Orthopedic Surgery | Admitting: Orthopedic Surgery

## 2023-08-24 ENCOUNTER — Encounter (HOSPITAL_COMMUNITY): Payer: Self-pay | Admitting: Orthopedic Surgery

## 2023-08-24 DIAGNOSIS — N400 Enlarged prostate without lower urinary tract symptoms: Secondary | ICD-10-CM | POA: Diagnosis present

## 2023-08-24 DIAGNOSIS — Z8616 Personal history of COVID-19: Secondary | ICD-10-CM

## 2023-08-24 DIAGNOSIS — S46312A Strain of muscle, fascia and tendon of triceps, left arm, initial encounter: Secondary | ICD-10-CM

## 2023-08-24 DIAGNOSIS — Z79899 Other long term (current) drug therapy: Secondary | ICD-10-CM

## 2023-08-24 DIAGNOSIS — T84038A Mechanical loosening of other internal prosthetic joint, initial encounter: Secondary | ICD-10-CM

## 2023-08-24 DIAGNOSIS — Z96621 Presence of right artificial elbow joint: Secondary | ICD-10-CM | POA: Diagnosis present

## 2023-08-24 DIAGNOSIS — Y792 Prosthetic and other implants, materials and accessory orthopedic devices associated with adverse incidents: Secondary | ICD-10-CM | POA: Diagnosis present

## 2023-08-24 DIAGNOSIS — Z96622 Presence of left artificial elbow joint: Secondary | ICD-10-CM | POA: Diagnosis present

## 2023-08-24 DIAGNOSIS — F40240 Claustrophobia: Secondary | ICD-10-CM | POA: Diagnosis present

## 2023-08-24 DIAGNOSIS — G8929 Other chronic pain: Secondary | ICD-10-CM | POA: Diagnosis present

## 2023-08-24 DIAGNOSIS — T84068A Wear of articular bearing surface of other internal prosthetic joint, initial encounter: Principal | ICD-10-CM | POA: Diagnosis present

## 2023-08-24 DIAGNOSIS — M19021 Primary osteoarthritis, right elbow: Secondary | ICD-10-CM | POA: Diagnosis present

## 2023-08-24 DIAGNOSIS — Z9079 Acquired absence of other genital organ(s): Secondary | ICD-10-CM

## 2023-08-24 DIAGNOSIS — E785 Hyperlipidemia, unspecified: Secondary | ICD-10-CM | POA: Diagnosis present

## 2023-08-24 HISTORY — PX: TOTAL ELBOW ARTHROPLASTY: SHX812

## 2023-08-24 LAB — CBC
HCT: 39.7 % (ref 39.0–52.0)
Hemoglobin: 13.8 g/dL (ref 13.0–17.0)
MCH: 31.4 pg (ref 26.0–34.0)
MCHC: 34.8 g/dL (ref 30.0–36.0)
MCV: 90.2 fL (ref 80.0–100.0)
Platelets: 235 10*3/uL (ref 150–400)
RBC: 4.4 MIL/uL (ref 4.22–5.81)
RDW: 13.1 % (ref 11.5–15.5)
WBC: 13 10*3/uL — ABNORMAL HIGH (ref 4.0–10.5)
nRBC: 0 % (ref 0.0–0.2)

## 2023-08-24 LAB — ABO/RH: ABO/RH(D): A POS

## 2023-08-24 SURGERY — ARTHROPLASTY, ELBOW, TOTAL
Anesthesia: General | Site: Elbow | Laterality: Left

## 2023-08-24 MED ORDER — SODIUM CHLORIDE 0.9 % IR SOLN
Status: DC | PRN
Start: 2023-08-24 — End: 2023-08-24
  Administered 2023-08-24: 2000 mL

## 2023-08-24 MED ORDER — PHENYLEPHRINE 80 MCG/ML (10ML) SYRINGE FOR IV PUSH (FOR BLOOD PRESSURE SUPPORT)
PREFILLED_SYRINGE | INTRAVENOUS | Status: DC | PRN
  Administered 2023-08-24 (×3): 160 ug via INTRAVENOUS
  Administered 2023-08-24 (×4): 80 ug via INTRAVENOUS

## 2023-08-24 MED ORDER — FAMOTIDINE 20 MG PO TABS: 20.0000 mg | ORAL_TABLET | Freq: Two times a day (BID) | ORAL | Status: DC | PRN

## 2023-08-24 MED ORDER — NEOSTIGMINE METHYLSULFATE 10 MG/10ML IV SOLN
INTRAVENOUS | Status: DC | PRN
Start: 2023-08-24 — End: 2023-08-24
  Administered 2023-08-24: 3 mg via INTRAVENOUS

## 2023-08-24 MED ORDER — FENTANYL CITRATE (PF) 250 MCG/5ML IJ SOLN
INTRAMUSCULAR | Status: AC
  Filled 2023-08-24: qty 5

## 2023-08-24 MED ORDER — DEXAMETHASONE SODIUM PHOSPHATE 10 MG/ML IJ SOLN
INTRAMUSCULAR | Status: AC
  Filled 2023-08-24: qty 1

## 2023-08-24 MED ORDER — ONDANSETRON HCL 4 MG/2ML IJ SOLN
INTRAMUSCULAR | Status: AC
  Filled 2023-08-24: qty 2

## 2023-08-24 MED ORDER — METHOCARBAMOL 500 MG PO TABS
500.0000 mg | ORAL_TABLET | Freq: Four times a day (QID) | ORAL | Status: DC | PRN
  Administered 2023-08-24 – 2023-08-26 (×5): 500 mg via ORAL
  Filled 2023-08-24 (×5): qty 1

## 2023-08-24 MED ORDER — ONDANSETRON HCL 4 MG PO TABS: 4.0000 mg | ORAL_TABLET | Freq: Four times a day (QID) | ORAL | Status: DC | PRN

## 2023-08-24 MED ORDER — ALPRAZOLAM 0.5 MG PO TABS
0.5000 mg | ORAL_TABLET | Freq: Four times a day (QID) | ORAL | Status: DC | PRN
  Administered 2023-08-25: 0.5 mg via ORAL
  Filled 2023-08-24: qty 1

## 2023-08-24 MED ORDER — LIDOCAINE 2% (20 MG/ML) 5 ML SYRINGE
INTRAMUSCULAR | Status: AC
  Filled 2023-08-24: qty 5

## 2023-08-24 MED ORDER — ROCURONIUM BROMIDE 10 MG/ML (PF) SYRINGE
PREFILLED_SYRINGE | INTRAVENOUS | Status: AC
  Filled 2023-08-24: qty 10

## 2023-08-24 MED ORDER — ALBUMIN HUMAN 5 % IV SOLN: 12.5000 g | Freq: Once | INTRAVENOUS | Status: DC

## 2023-08-24 MED ORDER — MAGNESIUM CITRATE PO SOLN: 1.0000 | Freq: Once | ORAL | Status: DC | PRN

## 2023-08-24 MED ORDER — LACTATED RINGERS IV SOLN: INTRAVENOUS | Status: DC

## 2023-08-24 MED ORDER — ALBUMIN HUMAN 5 % IV SOLN
INTRAVENOUS | Status: AC
  Filled 2023-08-24: qty 500

## 2023-08-24 MED ORDER — GLYCOPYRROLATE 0.2 MG/ML IJ SOLN
INTRAMUSCULAR | Status: DC | PRN
Start: 2023-08-24 — End: 2023-08-24
  Administered 2023-08-24: .4 mg via INTRAVENOUS
  Administered 2023-08-24: .2 mg via INTRAVENOUS

## 2023-08-24 MED ORDER — CEFAZOLIN SODIUM 1 G IJ SOLR
INTRAMUSCULAR | Status: AC
  Filled 2023-08-24: qty 20

## 2023-08-24 MED ORDER — HYDROMORPHONE HCL 1 MG/ML IJ SOLN: 0.5000 mg | INTRAMUSCULAR | Status: DC | PRN

## 2023-08-24 MED ORDER — OXYCODONE HCL 5 MG PO TABS
5.0000 mg | ORAL_TABLET | ORAL | Status: DC | PRN
  Administered 2023-08-25: 5 mg via ORAL
  Administered 2023-08-25 (×3): 10 mg via ORAL
  Administered 2023-08-25 – 2023-08-26 (×3): 5 mg via ORAL
  Filled 2023-08-24: qty 1
  Filled 2023-08-24 (×4): qty 2
  Filled 2023-08-24 (×3): qty 1

## 2023-08-24 MED ORDER — ACETAMINOPHEN 500 MG PO TABS
1000.0000 mg | ORAL_TABLET | Freq: Four times a day (QID) | ORAL | Status: AC
  Administered 2023-08-24 – 2023-08-25 (×4): 1000 mg via ORAL
  Filled 2023-08-24 (×4): qty 2

## 2023-08-24 MED ORDER — PROMETHAZINE HCL 12.5 MG RE SUPP: 12.5000 mg | Freq: Four times a day (QID) | RECTAL | Status: DC | PRN

## 2023-08-24 MED ORDER — PROPOFOL 10 MG/ML IV BOLUS
INTRAVENOUS | Status: DC | PRN
  Administered 2023-08-24: 150 mg via INTRAVENOUS

## 2023-08-24 MED ORDER — GLYCOPYRROLATE PF 0.2 MG/ML IJ SOSY
PREFILLED_SYRINGE | INTRAMUSCULAR | Status: AC
  Filled 2023-08-24: qty 2

## 2023-08-24 MED ORDER — ROPIVACAINE HCL 5 MG/ML IJ SOLN
INTRAMUSCULAR | Status: DC | PRN
Start: 2023-08-24 — End: 2023-08-24
  Administered 2023-08-24: 30 mL via PERINEURAL

## 2023-08-24 MED ORDER — MIDAZOLAM HCL 2 MG/2ML IJ SOLN
INTRAMUSCULAR | Status: AC
  Filled 2023-08-24: qty 2

## 2023-08-24 MED ORDER — HYDROMORPHONE HCL 2 MG PO TABS: 2.0000 mg | ORAL_TABLET | ORAL | Status: DC | PRN

## 2023-08-24 MED ORDER — ONDANSETRON HCL 4 MG/2ML IJ SOLN: 4.0000 mg | Freq: Four times a day (QID) | INTRAMUSCULAR | Status: DC | PRN

## 2023-08-24 MED ORDER — VITAMIN C 500 MG PO TABS
1000.0000 mg | ORAL_TABLET | Freq: Every morning | ORAL | Status: DC
  Administered 2023-08-25 – 2023-08-26 (×2): 1000 mg via ORAL
  Filled 2023-08-24 (×2): qty 2

## 2023-08-24 MED ORDER — ALBUMIN HUMAN 5 % IV SOLN
INTRAVENOUS | Status: DC | PRN
Start: 2023-08-24 — End: 2023-08-24

## 2023-08-24 MED ORDER — FENTANYL CITRATE (PF) 250 MCG/5ML IJ SOLN
INTRAMUSCULAR | Status: DC | PRN
  Administered 2023-08-24 (×2): 50 ug via INTRAVENOUS
  Administered 2023-08-24: 100 ug via INTRAVENOUS

## 2023-08-24 MED ORDER — CEFAZOLIN SODIUM-DEXTROSE 2-4 GM/100ML-% IV SOLN
2.0000 g | INTRAVENOUS | Status: AC
  Administered 2023-08-24 (×2): 2 g via INTRAVENOUS
  Filled 2023-08-24: qty 100

## 2023-08-24 MED ORDER — ACETAMINOPHEN 325 MG PO TABS
325.0000 mg | ORAL_TABLET | Freq: Four times a day (QID) | ORAL | Status: DC | PRN
  Administered 2023-08-26 (×2): 650 mg via ORAL
  Filled 2023-08-24 (×2): qty 2

## 2023-08-24 MED ORDER — ORAL CARE MOUTH RINSE: 15.0000 mL | Freq: Once | OROMUCOSAL | Status: AC

## 2023-08-24 MED ORDER — ONDANSETRON HCL 4 MG/2ML IJ SOLN
INTRAMUSCULAR | Status: DC | PRN
  Administered 2023-08-24: 4 mg via INTRAVENOUS

## 2023-08-24 MED ORDER — ROCURONIUM BROMIDE 10 MG/ML (PF) SYRINGE
PREFILLED_SYRINGE | INTRAVENOUS | Status: DC | PRN
  Administered 2023-08-24: 50 mg via INTRAVENOUS

## 2023-08-24 MED ORDER — CHLORHEXIDINE GLUCONATE 0.12 % MT SOLN
15.0000 mL | Freq: Once | OROMUCOSAL | Status: AC
  Administered 2023-08-24: 15 mL via OROMUCOSAL
  Filled 2023-08-24: qty 15

## 2023-08-24 MED ORDER — CEFAZOLIN SODIUM-DEXTROSE 1-4 GM/50ML-% IV SOLN
1.0000 g | Freq: Three times a day (TID) | INTRAVENOUS | Status: DC
  Administered 2023-08-24 – 2023-08-26 (×4): 1 g via INTRAVENOUS
  Filled 2023-08-24 (×4): qty 50

## 2023-08-24 MED ORDER — EPHEDRINE 5 MG/ML INJ
INTRAVENOUS | Status: AC
  Filled 2023-08-24: qty 5

## 2023-08-24 MED ORDER — BUPIVACAINE HCL (PF) 0.25 % IJ SOLN
INTRAMUSCULAR | Status: AC
  Filled 2023-08-24: qty 30

## 2023-08-24 MED ORDER — GLYCOPYRROLATE PF 0.2 MG/ML IJ SOSY
PREFILLED_SYRINGE | INTRAMUSCULAR | Status: AC
  Filled 2023-08-24: qty 1

## 2023-08-24 MED ORDER — ALBUMIN HUMAN 5 % IV SOLN
12.5000 g | Freq: Once | INTRAVENOUS | Status: AC
  Administered 2023-08-24: 12.5 g via INTRAVENOUS

## 2023-08-24 MED ORDER — NEOSTIGMINE METHYLSULFATE 3 MG/3ML IV SOSY
PREFILLED_SYRINGE | INTRAVENOUS | Status: AC
  Filled 2023-08-24: qty 3

## 2023-08-24 MED ORDER — EPHEDRINE SULFATE-NACL 50-0.9 MG/10ML-% IV SOSY
PREFILLED_SYRINGE | INTRAVENOUS | Status: DC | PRN
  Administered 2023-08-24: 10 mg via INTRAVENOUS
  Administered 2023-08-24: 5 mg via INTRAVENOUS
  Administered 2023-08-24: 10 mg via INTRAVENOUS

## 2023-08-24 MED ORDER — LIDOCAINE 2% (20 MG/ML) 5 ML SYRINGE
INTRAMUSCULAR | Status: DC | PRN
  Administered 2023-08-24: 40 mg via INTRAVENOUS

## 2023-08-24 MED ORDER — CEFAZOLIN SODIUM-DEXTROSE 1-4 GM/50ML-% IV SOLN
1.0000 g | INTRAVENOUS | Status: AC
  Administered 2023-08-24: 1 g via INTRAVENOUS
  Filled 2023-08-24: qty 50

## 2023-08-24 MED ORDER — MIDAZOLAM HCL 2 MG/2ML IJ SOLN
INTRAMUSCULAR | Status: DC | PRN
  Administered 2023-08-24: 2 mg via INTRAVENOUS

## 2023-08-24 MED ORDER — 0.9 % SODIUM CHLORIDE (POUR BTL) OPTIME
TOPICAL | Status: DC | PRN
  Administered 2023-08-24: 1000 mL

## 2023-08-24 MED ORDER — PHENYLEPHRINE HCL-NACL 20-0.9 MG/250ML-% IV SOLN
INTRAVENOUS | Status: DC | PRN
Start: 2023-08-24 — End: 2023-08-24
  Administered 2023-08-24: 20 ug/min via INTRAVENOUS

## 2023-08-24 MED ORDER — DOCUSATE SODIUM 100 MG PO CAPS
100.0000 mg | ORAL_CAPSULE | Freq: Two times a day (BID) | ORAL | Status: DC
  Administered 2023-08-24 – 2023-08-26 (×4): 100 mg via ORAL
  Filled 2023-08-24 (×4): qty 1

## 2023-08-24 MED ORDER — METHOCARBAMOL 1000 MG/10ML IJ SOLN: 500.0000 mg | Freq: Four times a day (QID) | INTRAVENOUS | Status: DC | PRN

## 2023-08-24 MED ORDER — PROPOFOL 10 MG/ML IV BOLUS
INTRAVENOUS | Status: AC
  Filled 2023-08-24: qty 20

## 2023-08-24 SURGICAL SUPPLY — 81 items
BAG COUNTER SPONGE SURGICOUNT (BAG) ×1 IMPLANT
BAG SPNG CNTER NS LX DISP (BAG) ×1
BLADE BONE MILL MEDIUM (MISCELLANEOUS) IMPLANT
BLADE LONG MED 31X9 (MISCELLANEOUS) ×1 IMPLANT
BLADE SURG 15 STRL LF DISP TIS (BLADE) IMPLANT
BLADE SURG 15 STRL SS (BLADE) ×10
BNDG CMPR 5X3 KNIT ELC UNQ LF (GAUZE/BANDAGES/DRESSINGS)
BNDG CMPR 9X4 STRL LF SNTH (GAUZE/BANDAGES/DRESSINGS)
BNDG COHESIVE 4X5 TAN STRL (GAUZE/BANDAGES/DRESSINGS) ×1 IMPLANT
BNDG ELASTIC 3INX 5YD STR LF (GAUZE/BANDAGES/DRESSINGS) ×1 IMPLANT
BNDG ELASTIC 4X5.8 VLCR STR LF (GAUZE/BANDAGES/DRESSINGS) ×1 IMPLANT
BNDG ESMARK 4X9 LF (GAUZE/BANDAGES/DRESSINGS) ×1 IMPLANT
BNDG GAUZE DERMACEA FLUFF 4 (GAUZE/BANDAGES/DRESSINGS) ×3 IMPLANT
BNDG GZE DERMACEA 4 6PLY (GAUZE/BANDAGES/DRESSINGS) ×2
CEMENT BONE REFOBACIN R1X40 US (Cement) IMPLANT
CEMENT RESTRICT CLEARCUT 12 (Cement) IMPLANT
CORD BIPOLAR FORCEPS 12FT (ELECTRODE) ×1 IMPLANT
COVER SURGICAL LIGHT HANDLE (MISCELLANEOUS) ×1 IMPLANT
CUFF TOURN SGL QUICK 18X4 (TOURNIQUET CUFF) ×1 IMPLANT
CUFF TOURN SGL QUICK 24 (TOURNIQUET CUFF)
CUFF TRNQT CYL 24X4X16.5-23 (TOURNIQUET CUFF) IMPLANT
DISC ULNA E+ STD ELBW (Shoulder) IMPLANT
DRAPE INCISE IOBAN 66X45 STRL (DRAPES) ×1 IMPLANT
DRAPE OEC MINIVIEW 54X84 (DRAPES) IMPLANT
DRSG ADAPTIC 3X8 NADH LF (GAUZE/BANDAGES/DRESSINGS) IMPLANT
EVACUATOR 1/8 PVC DRAIN (DRAIN) IMPLANT
GAUZE SPONGE 4X4 12PLY STRL (GAUZE/BANDAGES/DRESSINGS) ×1 IMPLANT
GAUZE XEROFORM 1X8 LF (GAUZE/BANDAGES/DRESSINGS) ×1 IMPLANT
GAUZE XEROFORM 5X9 LF (GAUZE/BANDAGES/DRESSINGS) IMPLANT
GLOVE BIOGEL M 8.0 STRL (GLOVE) ×1 IMPLANT
GLOVE BIOGEL PI IND STRL 7.5 (GLOVE) IMPLANT
GLOVE INDICATOR 7.5 STRL GRN (GLOVE) IMPLANT
GLOVE SS BIOGEL STRL SZ 8 (GLOVE) ×1 IMPLANT
GLOVE SURG SS PI 6.5 STRL IVOR (GLOVE) IMPLANT
GOWN STRL REUS W/ TWL LRG LVL3 (GOWN DISPOSABLE) ×2 IMPLANT
GOWN STRL REUS W/ TWL XL LVL3 (GOWN DISPOSABLE) ×3 IMPLANT
GOWN STRL REUS W/TWL LRG LVL3 (GOWN DISPOSABLE) ×2
GOWN STRL REUS W/TWL XL LVL3 (GOWN DISPOSABLE) ×3
GRAFT BNE HEAD FEM 43 (Bone Implant) IMPLANT
GRAFT HEAD FEMORAL 43MM TISSUE (Bone Implant) ×1 IMPLANT
HANDPIECE INTERPULSE COAX TIP (DISPOSABLE) ×1
HUMERAL CON SET-HEXALOBULAR (Orthopedic Implant) ×1 IMPLANT
KIT BASIN OR (CUSTOM PROCEDURE TRAY) ×1 IMPLANT
KIT DISC CONDYLE HEXALOBULAR (Orthopedic Implant) IMPLANT
KIT TRANSTIBIAL (DISPOSABLE) IMPLANT
KIT TURNOVER KIT B (KITS) ×1 IMPLANT
LOOP VASCLR MAXI BLUE 18IN ST (MISCELLANEOUS) IMPLANT
LOOP VASCULAR MAXI 18 BLUE (MISCELLANEOUS)
MANIFOLD NEPTUNE II (INSTRUMENTS) ×1 IMPLANT
NDL HYPO 25GX1X1/2 BEV (NEEDLE) IMPLANT
NEEDLE HYPO 25GX1X1/2 BEV (NEEDLE)
NOZZLE OPTIVAC SLIM 4154 (MISCELLANEOUS) IMPLANT
NS IRRIG 1000ML POUR BTL (IV SOLUTION) ×1 IMPLANT
PACK ORTHO EXTREMITY (CUSTOM PROCEDURE TRAY) ×1 IMPLANT
PAD ARMBOARD 7.5X6 YLW CONV (MISCELLANEOUS) ×2 IMPLANT
PAD CAST 4YDX4 CTTN HI CHSV (CAST SUPPLIES) ×2 IMPLANT
PADDING CAST COTTON 4X4 STRL (CAST SUPPLIES) ×2
RETRIEVER SUT HEWSON (MISCELLANEOUS) IMPLANT
SET HNDPC FAN SPRY TIP SCT (DISPOSABLE) IMPLANT
SLING ARM FOAM STRAP XLG (SOFTGOODS) IMPLANT
SOL PREP POV-IOD 4OZ 10% (MISCELLANEOUS) ×3 IMPLANT
SPLINT FIBERGLASS 4X30 (CAST SUPPLIES) IMPLANT
STEM HUM FLANGED ELBW 6X150 LT (Stem) IMPLANT
SUT 0 FIBERLOOP 38 BLUE TPR ND (SUTURE) ×1
SUT FIBERWIRE #2 38 T-5 BLUE (SUTURE) ×1
SUT FIBERWIRE 2-0 18 17.9 3/8 (SUTURE) ×1
SUT PROLENE 3 0 PS 2 (SUTURE) IMPLANT
SUT PROLENE 4 0 PS 2 18 (SUTURE) IMPLANT
SUT VIC AB 2-0 CT1 27 (SUTURE) ×1
SUT VIC AB 2-0 CT1 TAPERPNT 27 (SUTURE) IMPLANT
SUTURE 0 FIBERLP 38 BLU TPR ND (SUTURE) IMPLANT
SUTURE FIBERWR #2 38 T-5 BLUE (SUTURE) IMPLANT
SUTURE FIBERWR 2-0 18 17.9 3/8 (SUTURE) IMPLANT
SYR CONTROL 10ML LL (SYRINGE) IMPLANT
TOWEL GREEN STERILE (TOWEL DISPOSABLE) ×2 IMPLANT
TOWEL GREEN STERILE FF (TOWEL DISPOSABLE) ×1 IMPLANT
TOWER CARTRIDGE SMART MIX (DISPOSABLE) IMPLANT
TUBE CONNECTING 12X1/4 (SUCTIONS) IMPLANT
UNDERPAD 30X36 HEAVY ABSORB (UNDERPADS AND DIAPERS) ×1 IMPLANT
VASCULAR TIE MAXI BLUE 18IN ST (MISCELLANEOUS)
WATER STERILE IRR 1000ML POUR (IV SOLUTION) ×1 IMPLANT

## 2023-08-24 NOTE — Anesthesia Preprocedure Evaluation (Addendum)
Anesthesia Evaluation  Patient identified by MRN, date of birth, ID band Patient awake    Reviewed: Allergy & Precautions, NPO status , Patient's Chart, lab work & pertinent test results  Airway Mallampati: II  TM Distance: >3 FB Neck ROM: Full    Dental  (+) Teeth Intact, Dental Advisory Given   Pulmonary    breath sounds clear to auscultation       Cardiovascular negative cardio ROS  Rhythm:Regular Rate:Normal     Neuro/Psych   Anxiety     negative neurological ROS     GI/Hepatic negative GI ROS, Neg liver ROS,,,  Endo/Other  negative endocrine ROS    Renal/GU negative Renal ROS     Musculoskeletal  (+) Arthritis ,    Abdominal   Peds  Hematology negative hematology ROS (+)   Anesthesia Other Findings   Reproductive/Obstetrics                             Anesthesia Physical Anesthesia Plan  ASA: 2  Anesthesia Plan: General   Post-op Pain Management: Regional block*   Induction: Intravenous  PONV Risk Score and Plan: 3 and Ondansetron, Dexamethasone and Midazolam  Airway Management Planned: Oral ETT  Additional Equipment: None  Intra-op Plan:   Post-operative Plan: Extubation in OR  Informed Consent: I have reviewed the patients History and Physical, chart, labs and discussed the procedure including the risks, benefits and alternatives for the proposed anesthesia with the patient or authorized representative who has indicated his/her understanding and acceptance.     Dental advisory given  Plan Discussed with: CRNA  Anesthesia Plan Comments:        Anesthesia Quick Evaluation

## 2023-08-24 NOTE — H&P (Addendum)
Tyler Baxter is an 62 y.o. male.   Chief Complaint: The patient presents for revision left total elbow arthroplasty. HPI: Patient presents for revision left total elbow arthroplasty.  The patient had index procedure performed in 2009.  His elbow has had progressive loosening over the last year.  His polyethylene component has worn out and subsequent loosening of the humeral stem is notable.  The ulna appears to be well-fixed however we will assess intraoperatively.  The patient understands the operation in detail.  The patient understands the operation to include reconstruction triceps is necessary with possible tendon graft, revision total of arthroplasty with allograft bone graft and repair reconstruction techniques as necessary.  I discussed all issues with patient and his wife in detail.  Patient presents for evaluation and treatment of the of their upper extremity predicament. The patient denies neck, back, chest or  abdominal pain. The patient notes that they have no lower extremity problems. The patients primary complaint is noted. We are planning surgical care pathway for the upper extremity.   Past Medical History:  Diagnosis Date   Arthritis    BPH (benign prostatic hyperplasia)    Claustrophobia    Family history of adverse reaction to anesthesia    mother -- ponv   History of COVID-19    01-26-2020 positive result in epic, per pt mild symptoms that resolved ;  and positive 08/ 2021 with no symptoms   Hyperlipidemia    Lower urinary tract symptoms (LUTS)     Past Surgical History:  Procedure Laterality Date   ELBOW SURGERY Bilateral    2006 and 2007   NM RENAL LASIX ([facility] HX) Bilateral 2000   PROSTATE SURGERY  12/02/2021   THULIUM LASER TURP (TRANSURETHRAL RESECTION OF PROSTATE) N/A 12/02/2021   Procedure: THULIUM LASER TURP (TRANSURETHRAL RESECTION OF PROSTATE);  Surgeon: Jerilee Field, MD;  Location[facility];  Service: Urology;  Laterality:  N/A;   TOTAL ELBOW ARTHROPLASTY Bilateral    right 12-01-2007;  left 03-08-2008 both @[facility]  by dr [name]   TOTAL ELBOW ARTHROPLASTY Right 11/14/2015   Procedure: RIGHT TOTAL ELBOW REVISION  ARTHROPLASTY ;  Surgeon: Dominica Severin, MD;  Location: [facility] OR;  Service: Orthopedics;  Laterality: Right;    History reviewed. No pertinent family history. Social History:  reports that he has never smoked. He has never used smokeless tobacco. He reports that he does not drink alcohol and does not use drugs.  Allergies: No Known Allergies  Medications Prior to Admission  Medication Sig Dispense Refill   ascorbic acid (VITAMIN C) 500 MG tablet Take 1,000 mg by mouth in the morning.     dorzolamide-timolol (COSOPT) 2-0.5 % ophthalmic solution Place 1 drop into the left eye 2 (two) times daily.     L-LYSINE PO Take 1 tablet by mouth in the morning.     Menthol, Topical Analgesic, (BIOFREEZE COOL THE PAIN LARGE) 5 % PTCH Place 1 patch onto the skin daily.     Multiple Vitamins-Minerals (ZINC PO) Take 1 tablet by mouth at bedtime.     Nutritional Supplements (FRUIT & VEGETABLE DAILY) CAPS Take 1 capsule by mouth at bedtime. Balance of Nature Multivitamin     Omega-3 Fatty Acids (FISH OIL PO) Take 1 capsule by mouth at bedtime.     Polyethyl Glycol-Propyl Glycol (SYSTANE) 0.4-0.3 % SOLN Place 1 drop into both eyes in the morning.     rosuvastatin (CRESTOR) 10 MG tablet Take 5 mg by mouth at bedtime.  tadalafil (CIALIS) 5 MG tablet Take 5 mg by mouth daily as needed (erectile dysfunction).      No results found for this or any previous visit (from the past 48 hour(s)). No results found.  Review of Systems  HENT: Negative.    Respiratory: Negative.    Cardiovascular: Negative.   Genitourinary: Negative.   Neurological: Negative.   Hematological: Negative.   Psychiatric/Behavioral: Negative.     Blood pressure 119/72, pulse 71, temperature 98 F (36.7 C), temperature source Oral, resp. rate 18,  height 5\' 10"  (1.778 m), weight 77.1 kg, SpO2 97%. Physical Exam patient has a loose total hip arthroplasty left side.  He is greater than 15 years out from his index procedure in 2009.  X-rays reveal loosening and humeral stem with dislodgment of the screw and the ulna polyethylene component.  This is a discovery elbow implant.  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  He has a bursal accumulation posteriorly.  He has no evidence of infection at this time.  I reviewed these issues with him at length.  He is stable to vascular examination and has no skin change or adenopathy.  I reviewed his x-rays and findings in great detail we will plan to proceed with a revision total elbow arthroplasty left upper extremity.  His right elbow revision looks quite well there were no complications regarding this.  Abdomen is nontender nondistended.  Chest is clear.  Heart regular rate.  HEENT is within normal limits.    Assessment/Plan Will plan for revision total hip arthroplasty is discussed extensively with the patient and his family.  All risk and benefits have been discussed and all questions encouraged and answered.  Arm is  marked.  Risk and benefits discussed.  We are planning surgery for your upper extremity. The risk and benefits of surgery to include risk of bleeding, infection, anesthesia,  damage to normal structures and failure of the surgery to accomplish its intended goals of relieving symptoms and restoring function have been discussed in detail. With this in mind we plan to proceed. I have specifically discussed with the patient the pre-and postoperative regime and the dos and don'ts and risk and benefits in great detail. Risk and benefits of surgery also include risk of dystrophy(CRPS), chronic nerve pain, failure of the healing process to go onto completion and other inherent risks of surgery The relavent the pathophysiology of the disease/injury process, as well as the  alternatives for treatment and postoperative course of action has been discussed in great detail with the patient who desires to proceed.  We will do everything in our power to help you (the patient) restore function to the upper extremity. It is a pleasure to see this patient today.   Tyler Cohn III, MD 08/24/2023, 7:19 AM

## 2023-08-24 NOTE — Anesthesia Procedure Notes (Signed)
Anesthesia Regional Block: Supraclavicular block   Pre-Anesthetic Checklist: , timeout performed,  Correct Patient, Correct Site, Correct Laterality,  Correct Procedure, Correct Position, site marked,  Risks and benefits discussed,  Surgical consent,  Pre-op evaluation,  At surgeon's request and post-op pain management  Laterality: Left  Prep: chloraprep       Needles:  Injection technique: Single-shot  Needle Type: Echogenic Stimulator Needle     Needle Length: 9cm  Needle Gauge: 21     Additional Needles:   Procedures:,,,, ultrasound used (permanent image in chart),,    Narrative:  Start time: 08/24/2023 7:15 AM End time: 08/24/2023 7:20 AM Injection made incrementally with aspirations every 5 mL.  Performed by: Personally  Anesthesiologist: Shelton Silvas, MD  Additional Notes: Discussed risks and benefits of the nerve block in detail, including but not limited vascular injury, permanent nerve damage and infection.   Patient tolerated the procedure well. Local anesthetic introduced in an incremental fashion under minimal resistance after negative aspirations. No paresthesias were elicited. After completion of the procedure, no acute issues were identified and patient continued to be monitored by RN.

## 2023-08-24 NOTE — Transfer of Care (Signed)
Immediate Anesthesia Transfer of Care Note  Patient: Tyler Baxter  Procedure(s) Performed: Bursectomy left elbow, revision total elbow arthroplasty left elbow with deep hardware removal and repair reconstruction and allograft bone graft is necessary (Left: Elbow)  Patient Location: PACU  Anesthesia Type:GA combined with regional for post-op pain  Level of Consciousness: awake and oriented  Airway & Oxygen Therapy: Patient Spontanous Breathing and Patient connected to nasal cannula oxygen  Post-op Assessment: Report given to RN and Post -op Vital signs reviewed and stable  Post vital signs: stable  Last Vitals:  Vitals Value Taken Time  BP 112/72 08/24/23 1430  Temp 36.8 C 08/24/23 1430  Pulse 101 08/24/23 1431  Resp 23 08/24/23 1431  SpO2 98 % 08/24/23 1431  Vitals shown include unfiled device data.  Last Pain:  Vitals:   08/24/23 0644  TempSrc:   PainSc: 7          Complications: No notable events documented.

## 2023-08-24 NOTE — Op Note (Signed)
Operative note 08/24/2023.  Date of dictation and operation 08/24/2023.  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  Rule out ulna prosthetic loosening.  Noted polyethylene wear significant in nature with breakdown about the total elbow arthroplasty integrity  Postoperative diagnosis: The same with central area of triceps tear, loose humeral prosthetic implant with noted polyethylene wear and loose hardware from the polyethylene about the ulnar component.  Stable ulnar component about the left elbow.  Retained cement about the humerus.  Surgical procedure performed: #1 extensive bursectomy left elbow #2 extensive capsulectomy capsulotomy and removal of inflammatory tissue with metallosis about the capsular investments.  #3 removal of hardware left elbow including the humeral component of a prior total elbow arthroplasty as well as cement and a loose screw from the polyethylene bearing about the ulnar component.  #4 revision arthroplasty left elbow with size 6 humeral stem placement Discovery elbow and impaction bone grafting with cement placement #5 triceps repair reconstruction with bony drill hole technique left elbow #610 view radiographic series as well as fluoroscopy performed examined and interpreted by myself  Surgeon Dominica Severin MD  Anesthesia General With preoperative block.  Estimated blood loss 150 cc.  Drains 1.  Tourniquet time the patient had 3 different tourniquet times the first 1 was less than 2 hours and the next 2 were less than 50 minutes apiece with a greater than 10-minute deflation time.  Indications for the procedure: This patient is a pleasant male who underwent previous total elbow arthroplasty about the left elbow.  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  He had a size for ulnar component and humeral component placed with cementing technique.  He did very well until recently when he fell and began having ensuing pain.  X-rays revealed loss of the cement mantle about the humerus as well as dislodgment of the bearing pin about his polyethylene and the ulnar component.  The patient was counseled at length in regards to risk and benefits of surgery including risk of infection bleeding anesthesia damage to normal structures including nerves arteries tendons and muscle.  I discussed him the complexity of the surgery.  Unfortunately the patient has not been an ultra compliant patient.  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  He has had a prior revision elbow procedure about the right side with intact humeral component and a longstem ulna component conversion.  He has the opposite appearance on the left side which is a loose humeral stem with cement retention and a well-fixed ulnar stem.  He understands the surgical endeavors.  I have great concern about his activity level going forward and we discussed this.  At present time we will do everything we can to try to help this gentleman of course.  Surgical procedure: Patient was taken to the operative theater.  Arm was blocked in the preop area.  He underwent a general anesthetic in the operative theater.  He underwent placement of sequential compression devices on his legs.  His body parts were well-padded and a Foley catheter was placed.  He was turned on his side well-padded and beanbag was inflated.  Axillary roll was placed and all body parts were well-padded.  After anesthesia was secured the patient underwent a prepped and draped in usual sterile fashion with 2  separate Hibiclens scrubs followed by 10-minute surgical Betadine scrub and paint.  Following this sterile drapes were placed and the operation commenced with tourniquet insufflation to 250 mmHg.  Incision was made  along prior incisions.  Utility area and posterior incision was made with skin flaps being elevated.  I immediately encountered a bursal accumulation of metallosis.  The bursa was identified and resected in its entirety it emanated from a central portion of the triceps.  This rendered the triceps incompetent in the central portion.  It was quite clear that the central portion of defect would be an issue and thus I split the triceps midline and peeled it away from the ulna and humeral investments.  At this time we exposed the prior total elbow arthroplasty.  I completed the bursectomy without difficulty.  This was a bursectomy left elbow with metallosis contained in the bursal region and a deficient triceps centrally from the bursal accumulation.  Following this we then very carefully and cautiously remove the screws from the ulnar component and detached the bearing surfaces of the ulna and humerus.  We essentially unlinked these.  A small pin that house the polyethylene for the ulna was retrieved as it was floating in the joint and had become dislodged likely when he had his traumatic injury.  Following this I then performed a anterior and posterior capsulectomy capsulotomy and removed all metallosis tissue about the capsular structures.  The patient tolerated this well.  I should note that I had to very carefully and cautiously perform a capsulotomy capsulectomy and make sure that we are very careful to avoid injury to neurovascular structures.  He had a very tight anterior capsule.  With knife blade I very carefully excised the investment so that we had ample room for dissection and removal of the humeral component.  The humeral component was ultimately removed without difficulty.  It was quite loose.  At this time we then set out to perform removal of the c-Met.  I used combination of curette grasper and other orthopedic devices to very carefully and cautiously remove the cement.  I brought in fluoroscopy  and with a grabber grabbed a lone piece of CMET that was present and quite elongated.  This was some cemented gone past the cement restrictor.  I also removed the prior cement restrictor placed.  Thus the cement and canal were very carefully debrided and all excessive cement was removed without difficulty.  This took a significant amount of time.  At about the 90-minute mark we deflated the tourniquet while we were continuing the cement removal in preparation of the humerus.  Once the cement was completely removed I then very carefully and cautiously began the broaching process.  The patient actually broached up to a size 6 and I felt a bigger stronger implant would be better.  We settled on a size 150 in length implant.  The patient tolerated this well.  Following this I performed trial placement of the implant.  We then very carefully and cautiously checked this. At this time we then appropriately measured the depth and inserted a size 12 cement restrictor.  This allowed for excellent restriction of the cement and the bone graft.  This fit tightly in the canal without difficulty. At this time we then took a femoral head and milled it.  This was an allograft femoral head.  Bone graft was placed around the shaft and implant and impacted.  An impaction grafting type procedure was performed without difficulty.  I  was very careful to pack around the prosthesis and make sure this was very carefully placed and filled the canal nicely.  This took a great deal of time.  Following this we then very carefully and cautiously had the tourniquet reinflated and continue the impaction grafting to my satisfaction.  Following this we then placed a new polyethylene bushing for the ulna.  This was done in standard technique.  We did remove the elbow polyethylene with discovery elbow extraction instruments previously.  Following this with the polyethylene at a -2 degrees temperature in the freezer we then placed it and put  it back in the ulnar component which was well-fixed.  Following this we then inserted the pushing pin.  This went quite well.  Once again, the ulna was well-fixed.  The polyethylene was exchanged to a new polyethylene piece as the old polyethylene had severe wear and loosening with dislodgment of his pin.  Following this we took additional radiographs of the construct.  I did perform a trial range of motion with the elbow linked up with the trial prosthesis I was quite pleased with this.  Following this we then mixed cement and checked the size 6 150 length implant from the Discovery elbow system.  This was the final implant.  I made sure this sat nicely.  The anterior flange looked very good.  I then dried this.  Following this we placed a cemented followed by placement of the humeral stem this was tapped into place nicely and had an excellent fit.  I was pleased with the cement mantle and the impaction bone grafting technique.  We then looked the ulna to the humerus with the bushing system of course tighten down the screws and were quite pleased with the capture.  All looked well.  He had full flexion and extension full pronation and supination and no complications.  The cement was allowed to cure.  Following this we then irrigated copiously as we did at multiple points during the operation.  I should note that we used pulsatile lavage for purposes of cleaning the canal and the anterior capsule.  I cannot understate the amount of painstaking dissection and remove all of the metallosis about the capsule and to make sure the canal was free of c-Met and clean.  Multiple rounds of irrigation were used.  I was quite pleased with this.  Both the humeral and the polyethylene placement went very nicely.  This was all checked under radiographic image at multiple points during the operation to make sure that the patient had adequate position and that I was well passed any weakening points in the humeral  region.  Once this was complete we also removed any excess cement while it was allowing to cure.  Once cured final x-rays were taken we are quite pleased with this.  Following this I made a drill hole from medial to lateral and placed a 0 and a #2 FiberWire through the transverse hole in the ulna very carefully made so I did not disrupt the ulnar component.  I then performed a repair of the triceps through the transosseous bony drill hole technique.  The central defect was brought together and I actually had very reasonable triceps to repair thus not needing an allograft or autograft.  I was pleased with the triceps reconstruction utilizing combination of interrupted and running FiberWire.  The bony drill holes sat nicely.  I should note that it all times I palpated the ulnar nerve which was previously anteriorly  transposed and I did not dissect this as it was out of the way of our field.  I should note that we did split the triceps midline due to the fact that there was a midline defect in the triceps.  This was sculpted off of the ulna and the humerus very carefully to leave a wonderful sleeve for repair.  We also performed a very aggressive capsular release so as to give him the best component for possible.  We had the tourniquet up during the curing of the cement for a final time after we had deflated the second time at less than an hour.  After the cement was cured we then deflated the tourniquet.  Thus 3 tourniquet x 1 less than 2 hours the other 2 less than 1 hour were utilized and were quite brief in terms of the second 2 times.  There was a long deflation time between the tourniquet times.  At this juncture we irrigated once again with pulsatile lavage as we did during multiple points during the operation.  We then were pleased with the final copy x-rays and sutured the wound with Prolene.  I placed a medium Hemovac drain that the patient tolerated quite well.  Following closure Adaptic Xeroform  standard posterior dressing an posterior fiberglass splint was placed.  There were no complicating features.  He had excellent refill and soft compartments and a good radial pulse.  There were no complicating features.  Going forward I will rehab him according to our standard total elbow arthroplasty protocol.  Should he have any problems we will be immediately available.  I discussed all issues with his wife Victorino Dike.  Given his activity level I do feel that we are very fortunate to get over 15 years out of the original elbow.  Hopefully he can change his lifestyle dramatically so that we can have a long survival of this implant.  I was quite pleased with the surgical outcome.  Hopefully he will do quite nicely into the future.  We did take interoperative cultures as this was a revision total elbow arthroplasty.  He will be admitted for IV antibiotics General Plus of observation and other measures as outlined.  Chikita Dogan MD

## 2023-08-24 NOTE — Anesthesia Procedure Notes (Addendum)
Procedure Name: Intubation Date/Time: 08/24/2023 8:27 AM  Performed by: April Holding, CRNAPre-anesthesia Checklist: Patient identified, Emergency Drugs available, Suction available and Patient being monitored Patient Re-evaluated:Patient Re-evaluated prior to induction Oxygen Delivery Method: Circle System Utilized Preoxygenation: Pre-oxygenation with 100% oxygen Induction Type: IV induction and Cricoid Pressure applied Ventilation: Mask ventilation without difficulty and Oral airway inserted - appropriate to patient size Laryngoscope Size: Miller and 2 Tube type: Oral Tube size: 7.5 mm Number of attempts: 1 Airway Equipment and Method: Stylet and Oral airway Placement Confirmation: ETT inserted through vocal cords under direct vision, positive ETCO2 and breath sounds checked- equal and bilateral Secured at: 23 cm Tube secured with: Tape Dental Injury: Teeth and Oropharynx as per pre-operative assessment

## 2023-08-25 NOTE — Progress Notes (Signed)
PT Cancellation Note and Discharge  Patient Details Name: Tyler Baxter MRN: [id number] DOB: 04-30-1961   Cancelled Treatment:    Reason Eval/Treat Not Completed: PT screened, no needs identified, will sign off. Discussed pt case with OT who reports pt is currently mobilizing at an independent level and does not require a formal PT evaluation at this time. PT signing off. If needs change, please reconsult.     Marylynn Pearson 08/25/2023, 9:44 AM  Conni Slipper, PT, DPT Acute Rehabilitation Services Secure Chat Preferred Office: [phone]

## 2023-08-25 NOTE — Progress Notes (Signed)
Patient ID: Tyler Baxter, male   DOB: 1961/06/24, 62 y.o.   MRN: [id number] Doing very well at bedside.  Patient is neurovascularly intact.  He has no signs of instability infection or dystrophy.  The patient is stable ligamentous examination.  The patient has intact sensation and good flexion extension of the fingers.  He appears to be neurovascularly intact without complications.  Drain is removed.  We will discontinue his Foley.  Hopefully move forward with discharge to home tomorrow.  No signs of DVT.  No signs of complications.  Chest is clear.  Vital signs are stable.  All looks quite well.  I had a lengthy conversation with the patient and his wife in regards to next steps in the plan of care as it relates to the surgical events and postop recovery plans.  All questions have been encouraged and answered.  Torian Quintero MD

## 2023-08-25 NOTE — Evaluation (Signed)
Occupational Therapy Evaluation Patient Details Name: Tyler Baxter MRN: [id number] DOB: Mar 02, 1961 Today's Date: 08/25/2023   History of Present Illness Pt is a 62 year old man admitted on 9/10 for revision of L total elbow arthoplasty. Pt has had multiple elbow surgeries in the past. PMH: anxiety, arthritis.   Clinical Impression   Pt educated in edema management, sling use and compensatory strategies for ADLs. Verbalized and/or demonstrated understanding. Pt mobilizing well OOB and encouraged to walk in halls. No further OT needs.              Functional Status Assessment  Patient has had a recent decline in their functional status and demonstrates the ability to make significant improvements in function in a reasonable and predictable amount of time.  Equipment Recommendations  None recommended by OT    Recommendations for Other Services       Precautions / Restrictions Precautions Precautions: None Restrictions Weight Bearing Restrictions: No LUE Weight Bearing: Weight bearing as tolerated      Mobility Bed Mobility Overal bed mobility: Modified Independent             General bed mobility comments: HOB up    Transfers Overall transfer level: Independent Equipment used: None                      Balance Overall balance assessment: Independent                                         ADL either performed or assessed with clinical judgement   ADL Overall ADL's : Modified independent                                       General ADL Comments: Pt is well versed in sling use and compensatory strategies for ADLs from prior surgeries. Educated in elevation, ice and moving hand frequently for edema management.     Vision Baseline Vision/History: 0 No visual deficits Ability to See in Adequate Light: 0 Adequate       Perception         Praxis         Pertinent Vitals/Pain Pain Assessment Pain Assessment:  Faces Faces Pain Scale: Hurts a little bit Pain Location: L elbow Pain Descriptors / Indicators: Sore Pain Intervention(s): Monitored during session, Repositioned, Premedicated before session     Extremity/Trunk Assessment Upper Extremity Assessment Upper Extremity Assessment: LUE deficits/detail LUE Deficits / Details: L elbow dressed, full AROM shoulder and hand LUE: Unable to fully assess due to immobilization LUE Coordination: decreased gross motor   Lower Extremity Assessment Lower Extremity Assessment: Overall WFL for tasks assessed   Cervical / Trunk Assessment Cervical / Trunk Assessment: Normal   Communication     Cognition Arousal: Alert Behavior During Therapy: WFL for tasks assessed/performed Overall Cognitive Status: Within Functional Limits for tasks assessed                                       General Comments       Exercises     Shoulder Instructions      Home Living Family/patient expects to be discharged to:: Private residence Living Arrangements: Spouse/significant other Available  Help at Discharge: Family;Available PRN/intermittently Type of Home: House Home Access: Level entry     Home Layout: One level     Bathroom Shower/Tub: Producer, television/film/video: Handicapped height                Prior Functioning/Environment Prior Level of Function : Independent/Modified Independent;Working/employed;Driving                        OT Problem List:        OT Treatment/Interventions:      OT Goals(Current goals can be found in the care plan section)    OT Frequency:      Co-evaluation              AM-PAC OT "6 Clicks" Daily Activity     Outcome Measure Help from another person eating meals?: None Help from another person taking care of personal grooming?: None Help from another person toileting, which includes using toliet, bedpan, or urinal?: None Help from another person bathing (including  washing, rinsing, drying)?: None Help from another person to put on and taking off regular upper body clothing?: None Help from another person to put on and taking off regular lower body clothing?: None 6 Click Score: 24   End of Session Nurse Communication: Mobility status  Activity Tolerance: Patient tolerated treatment well Patient left: in bed;with call bell/phone within reach  OT Visit Diagnosis: Pain                Time: 7829-5621 OT Time Calculation (min): 19 min Charges:  OT General Charges $OT Visit: 1 Visit OT Evaluation $OT Eval Low Complexity: 1 Low  Berna Spare, OTR/L Acute Rehabilitation Services Office: [phone]   Evern Bio 08/25/2023, 9:51 AM

## 2023-08-25 NOTE — Anesthesia Postprocedure Evaluation (Signed)
Anesthesia Post Note  Patient: Tyler Baxter  Procedure(s) Performed: Bursectomy left elbow, revision total elbow arthroplasty left elbow with deep hardware removal and repair reconstruction and allograft bone graft is necessary (Left: Elbow)     Patient location during evaluation: PACU Anesthesia Type: General and Regional Level of consciousness: awake and alert Pain management: pain level controlled Vital Signs Assessment: post-procedure vital signs reviewed and stable Respiratory status: spontaneous breathing, nonlabored ventilation, respiratory function stable and patient connected to nasal cannula oxygen Cardiovascular status: blood pressure returned to baseline and stable Postop Assessment: no apparent nausea or vomiting Anesthetic complications: no   No notable events documented.               Shelton Silvas

## 2023-08-26 ENCOUNTER — Encounter (HOSPITAL_COMMUNITY): Payer: Self-pay | Admitting: Orthopedic Surgery

## 2023-08-26 NOTE — Discharge Instructions (Signed)
We recommend that you to take vitamin C 1000 mg a day to promote healing. °We also recommend that if you require  pain medicine that you take a stool softener to prevent constipation as most pain medicines will have constipation side effects. We recommend either Peri-Colace or Senokot and recommend that you also consider adding MiraLAX as well to prevent the constipation affects from pain medicine if you are required to use them. These medicines are over the counter and may be purchased at a local pharmacy. A cup of yogurt and a probiotic can also be helpful during the recovery process as the medicines can disrupt your intestinal environment. °Keep bandage clean and dry.  Call for any problems.  No smoking.  Criteria for driving a car: you should be off your pain medicine for 7-8 hours, able to drive one handed(confident), thinking clearly and feeling able in your judgement to drive. °Continue elevation as it will decrease swelling.  If instructed by MD move your fingers within the confines of the bandage/splint.  Use ice if instructed by your MD. Call immediately for any sudden loss of feeling in your hand/arm or change in functional abilities of the extremity. °

## 2023-08-26 NOTE — Discharge Summary (Addendum)
Physician Discharge Summary  Patient ID: Tyler Baxter MRN: [id number] DOB/AGE: 1961-11-03 62 y.o.  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 Past Medical History:  Diagnosis Date   Arthritis    BPH (benign prostatic hyperplasia)    Claustrophobia    Family history of adverse reaction to anesthesia    mother -- ponv   History of COVID-19    01-26-2020 positive result in epic, per pt mild symptoms that resolved ;  and positive 08/ 2021 with no symptoms   Hyperlipidemia    Lower urinary tract symptoms (LUTS)     Discharge Diagnoses:  Principal Problem:   Arthritis of right elbow   Surgeries: Procedure(s): Bursectomy left elbow, revision total elbow arthroplasty left elbow with deep hardware removal and repair reconstruction and allograft bone graft is necessary on 08/24/2023    Consultants:   Discharged Condition: Improved  Hospital Course: Tyler Baxter is an 62 y.o. male who was admitted 08/24/2023 with a chief complaint of No chief complaint on file. 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  They were brought to the operating room on 08/24/2023 and underwent Procedure(s): Bursectomy left elbow, revision total elbow arthroplasty left elbow with deep hardware removal and repair reconstruction and allograft bone graft is necessary.    They were given perioperative antibiotics:  Anti-infectives (From admission, onward)    Start     Dose/Rate Route Frequency Ordered Stop   08/24/23 2300  ceFAZolin (ANCEF) IVPB 1 g/50 mL premix        1 g 100 mL/hr over 30 Minutes Intravenous Every 8 hours 08/24/23 1640 08/31/23 2159   08/24/23 1730  ceFAZolin (ANCEF) IVPB 1 g/50 mL premix        1 g 100 mL/hr over 30 Minutes Intravenous NOW 08/24/23 1640 08/24/23 1834    08/24/23 0615  ceFAZolin (ANCEF) IVPB 2g/100 mL premix        2 g 200 mL/hr over 30 Minutes Intravenous On call to O.R. 08/24/23 0606 08/24/23 1220     .  They were given sequential compression devices, early ambulation, and Other (comment) for DVT prophylaxis.  Recent vital signs: Patient Vitals for the past 24 hrs:  BP Temp Temp src Pulse Resp SpO2  08/26/23 0340 101/63 98.9 F (37.2 C) Oral 80 17 96 %  08/26/23 0056 (!) 96/59 98.9 F (37.2 C) Oral 86 17 96 %  08/25/23 1929 103/67 98.6 F (37 C) Oral 70 20 98 %  08/25/23 1659 101/72 98.2 F (36.8 C) Oral 67 20 100 %  08/25/23 1141 114/68 97.9 F (36.6 C) Oral 76 20 100 %  .  Recent laboratory studies: DG Elbow 2 Views Right  Result Date: 08/24/2023 CLINICAL DATA:  Missing instrument in OR. EXAM: RIGHT ELBOW - 2 VIEW COMPARISON:  November 14, 2015. FINDINGS: Single portable projection was obtained the distal left humerus and elbow. Status post left elbow arthroplasty. Humeral and ulnar components are noted, but no other radiopaque foreign body is noted. IMPRESSION: No other radiopaque foreign body is seen other than humeral and ulnar components arthroplasty. Electronically Signed   By: Tyler Baxter M.D.   On: 08/24/2023 17:32   DG MINI C-ARM IMAGE ONLY  Result Date: 08/24/2023 There is no interpretation for this exam.  This order is for images obtained during a surgical  procedure.  Please See "Surgeries" Tab for more information regarding the procedure.    Discharge Medications:     Diagnostic Studies: DG Elbow 2 Views Right  Result Date: 08/24/2023 CLINICAL DATA:  Missing instrument in OR. EXAM: RIGHT ELBOW - 2 VIEW COMPARISON:  November 14, 2015. FINDINGS: Single portable projection was obtained the distal left humerus and elbow. Status post left elbow arthroplasty. Humeral and ulnar components are noted, but no other radiopaque foreign body is noted. IMPRESSION: No other radiopaque foreign body is seen other than humeral and  ulnar components arthroplasty. Electronically Signed   By: Tyler Baxter M.D.   On: 08/24/2023 17:32   DG MINI C-ARM IMAGE ONLY  Result Date: 08/24/2023 There is no interpretation for this exam.  This order is for images obtained during a surgical procedure.  Please See "Surgeries" Tab for more information regarding the procedure.   MR PROSTATE W WO CONTRAST  Result Date: 08/19/2023 CLINICAL DATA:  62 year old male with elevated PSA.  PSA equal 9.67. EXAM: MR PROSTATE WITHOUT AND WITH CONTRAST TECHNIQUE: Multiplanar multisequence MRI images were obtained of the pelvis centered about the prostate. Pre and post contrast images were obtained. Patient was extremely claustrophobic even with anti anxiety medication. CONTRAST:  80 mL vueway COMPARISON:  None Available. FINDINGS: Prostate: Normal high signal intensity in the peripheral zone on T2 weighted imaging. There is linear striations in the peripheral zone suggesting prior inflammation. Small round loss of signal intensity on all sequences in the RIGHT mid gland suggest prostate calcification (image 21/series 8). No change from comparison exam No foci of restricted diffusion within the peripheral zone. Postcontrast T1 weighted imaging demonstrates no focal abnormal enhancement in the peripheral zone. The transitional zone is enlarged by capsulated nodules without suspicious imaging characteristics on T2 weighted imaging. Volume: 5.1 x 4.2 x 5.2 cm (volume = 58 cm^3) Transcapsular spread:  Absent Seminal vesicle involvement: Absent Neurovascular bundle involvement: Absent Pelvic adenopathy: Absent Bone metastasis: Absent Other findings: None IMPRESSION: 1. No high-grade carcinoma within the peripheral zone. Mild heterogeneity of signal suggests prior prostate inflammation. PI-RADS (v2.1): 2 2. Enlarged nodular transitional zone most consistent with benign prostate hypertrophy. PI-RADS(v2.1): 2 Electronically Signed   By: Tyler Baxter M.D.   On: 08/19/2023  14:57    They benefited maximally from their hospital stay and there were no complications.     Disposition: Discharge disposition: 01-Home or Self Care      Discharge Instructions     Call MD / Call 911   Complete by: As directed    If you experience chest pain or shortness of breath, CALL 911 and be transported to the hospital emergency room.  If you develope a fever above 101 F, pus (white drainage) or increased drainage or redness at the wound, or calf pain, call your surgeon's office.   Constipation Prevention   Complete by: As directed    Drink plenty of fluids.  Prune juice may be helpful.  You may use a stool softener, such as Colace (over the counter) 100 mg twice a day.  Use MiraLax (over the counter) for constipation as needed.   Diet - low sodium heart healthy   Complete by: As directed    Increase activity slowly as tolerated   Complete by: As directed    Post-operative opioid taper instructions:   Complete by: As directed    POST-OPERATIVE OPIOID TAPER INSTRUCTIONS: It is important to wean off of your opioid medication as soon as possible. If  you do not need pain medication after your surgery it is ok to stop day one. Opioids include: Codeine, Hydrocodone(Norco, Vicodin), Oxycodone(Percocet, oxycontin) and hydromorphone amongst others.  Long term and even short term use of opiods can cause: Increased pain response Dependence Constipation Depression Respiratory depression And more.  Withdrawal symptoms can include Flu like symptoms Nausea, vomiting And more Techniques to manage these symptoms Hydrate well Eat regular healthy meals Stay active Use relaxation techniques(deep breathing, meditating, yoga) Do Not substitute Alcohol to help with tapering If you have been on opioids for less than two weeks and do not have pain than it is ok to stop all together.  Plan to wean off of opioids This plan should start within one week post op of your joint  replacement. Maintain the same interval or time between taking each dose and first decrease the dose.  Cut the total daily intake of opioids by one tablet each day Next start to increase the time between doses. The last dose that should be eliminated is the evening dose.          Status post revision total elbow arthroplasty left upper extremity with bursectomy, revision humeral component and triceps reconstruction with impaction bone grafting.  Patient has done quite well postoperatively.  He will be discharged to home today.  I phoned in his medicines through my office EMR to [facility] pharmacy.  He will notify should any problems occur.  We have discussed all issues plans and concerns.  Should any problems arise he has my cellular phone number and will call me.  It was a pleasure to see and treat him.  I went over all issues precautions and other aspects of the care plan.  Discharge medicines: Oxycodone, Robaxin, Keflex, and Zofran  Signed: Oletta Cohn III 08/26/2023, 8:03 AM

## 2023-08-26 NOTE — Progress Notes (Signed)
Patient alert and oriented, void, ambulate, surgical site and dressing clean and dry no sign of infection. D/c instructions explain and given all questions answered. Pt. D/c home per order.

## 2023-08-29 LAB — AEROBIC/ANAEROBIC CULTURE W GRAM STAIN (SURGICAL/DEEP WOUND)
Culture: NO GROWTH
Culture: NO GROWTH
Culture: NO GROWTH

## 2023-08-30 NOTE — Discharge Summary (Signed)
Date of DC 08/26/2023 Dr [name]

## 2024-04-17 DIAGNOSIS — R972 Elevated prostate specific antigen [PSA]: Secondary | ICD-10-CM | POA: Diagnosis not present

## 2024-04-19 DIAGNOSIS — H4921 Sixth [abducent] nerve palsy, right eye: Secondary | ICD-10-CM | POA: Diagnosis not present

## 2024-04-24 DIAGNOSIS — R3912 Poor urinary stream: Secondary | ICD-10-CM | POA: Diagnosis not present

## 2024-04-24 DIAGNOSIS — N401 Enlarged prostate with lower urinary tract symptoms: Secondary | ICD-10-CM | POA: Diagnosis not present

## 2024-04-24 DIAGNOSIS — N5201 Erectile dysfunction due to arterial insufficiency: Secondary | ICD-10-CM | POA: Diagnosis not present

## 2024-04-24 DIAGNOSIS — R972 Elevated prostate specific antigen [PSA]: Secondary | ICD-10-CM | POA: Diagnosis not present

## 2024-05-01 DIAGNOSIS — M25522 Pain in left elbow: Secondary | ICD-10-CM | POA: Diagnosis not present

## 2024-05-01 DIAGNOSIS — Z4789 Encounter for other orthopedic aftercare: Secondary | ICD-10-CM | POA: Diagnosis not present

## 2024-07-13 DIAGNOSIS — Z4789 Encounter for other orthopedic aftercare: Secondary | ICD-10-CM | POA: Diagnosis not present

## 2024-07-13 DIAGNOSIS — G5630 Lesion of radial nerve, unspecified upper limb: Secondary | ICD-10-CM | POA: Diagnosis not present

## 2024-07-13 DIAGNOSIS — M25521 Pain in right elbow: Secondary | ICD-10-CM | POA: Diagnosis not present

## 2024-07-13 DIAGNOSIS — M25522 Pain in left elbow: Secondary | ICD-10-CM | POA: Diagnosis not present

## 2024-08-22 DIAGNOSIS — Z0001 Encounter for general adult medical examination with abnormal findings: Secondary | ICD-10-CM | POA: Diagnosis not present

## 2024-08-22 DIAGNOSIS — N400 Enlarged prostate without lower urinary tract symptoms: Secondary | ICD-10-CM | POA: Diagnosis not present

## 2024-08-22 DIAGNOSIS — E782 Mixed hyperlipidemia: Secondary | ICD-10-CM | POA: Diagnosis not present

## 2024-08-22 DIAGNOSIS — R972 Elevated prostate specific antigen [PSA]: Secondary | ICD-10-CM | POA: Diagnosis not present

## 2024-08-22 DIAGNOSIS — R7309 Other abnormal glucose: Secondary | ICD-10-CM | POA: Diagnosis not present

## 2024-08-22 DIAGNOSIS — E785 Hyperlipidemia, unspecified: Secondary | ICD-10-CM | POA: Diagnosis not present

## 2024-08-22 DIAGNOSIS — Z1331 Encounter for screening for depression: Secondary | ICD-10-CM | POA: Diagnosis not present

## 2024-08-22 DIAGNOSIS — Z6824 Body mass index (BMI) 24.0-24.9, adult: Secondary | ICD-10-CM | POA: Diagnosis not present
